# Patient Record
Sex: Female | Born: 1945 | Race: White | Hispanic: No | Marital: Married | Smoking: Never smoker
Health system: Southern US, Community
[De-identification: ages and names within clinical notes are randomized; demographics above are authoritative.]

## PROBLEM LIST (undated history)

## (undated) DIAGNOSIS — M1990 Unspecified osteoarthritis, unspecified site: Secondary | ICD-10-CM

## (undated) DIAGNOSIS — A879 Viral meningitis, unspecified: Secondary | ICD-10-CM

## (undated) DIAGNOSIS — Z9889 Other specified postprocedural states: Secondary | ICD-10-CM

## (undated) DIAGNOSIS — R112 Nausea with vomiting, unspecified: Secondary | ICD-10-CM

## (undated) DIAGNOSIS — C50919 Malignant neoplasm of unspecified site of unspecified female breast: Secondary | ICD-10-CM

## (undated) DIAGNOSIS — C50911 Malignant neoplasm of unspecified site of right female breast: Secondary | ICD-10-CM

## (undated) DIAGNOSIS — G039 Meningitis, unspecified: Secondary | ICD-10-CM

## (undated) DIAGNOSIS — I10 Essential (primary) hypertension: Secondary | ICD-10-CM

## (undated) HISTORY — PX: CATARACT EXTRACTION W/ INTRAOCULAR LENS  IMPLANT, BILATERAL: SHX1307

## (undated) HISTORY — PX: VAGINAL HYSTERECTOMY: SUR661

## (undated) HISTORY — PX: TONSILLECTOMY: SUR1361

## (undated) HISTORY — PX: APPENDECTOMY: SHX54

---

## 1984-09-16 DIAGNOSIS — G039 Meningitis, unspecified: Secondary | ICD-10-CM

## 1984-09-16 HISTORY — DX: Meningitis, unspecified: G03.9

## 1991-09-17 DIAGNOSIS — C50911 Malignant neoplasm of unspecified site of right female breast: Secondary | ICD-10-CM

## 1991-09-17 HISTORY — PX: BREAST LUMPECTOMY: SHX2

## 1991-09-17 HISTORY — DX: Malignant neoplasm of unspecified site of right female breast: C50.911

## 1999-01-09 ENCOUNTER — Other Ambulatory Visit: Admission: RE | Admit: 1999-01-09 | Discharge: 1999-01-09 | Payer: Self-pay | Admitting: Obstetrics and Gynecology

## 1999-01-25 ENCOUNTER — Other Ambulatory Visit: Admission: RE | Admit: 1999-01-25 | Discharge: 1999-01-25 | Payer: Self-pay | Admitting: Obstetrics and Gynecology

## 1999-07-10 ENCOUNTER — Encounter: Admission: RE | Admit: 1999-07-10 | Discharge: 1999-07-10 | Payer: Self-pay | Admitting: Radiation Oncology

## 1999-07-10 ENCOUNTER — Encounter: Payer: Self-pay | Admitting: Radiation Oncology

## 1999-07-31 ENCOUNTER — Other Ambulatory Visit: Admission: RE | Admit: 1999-07-31 | Discharge: 1999-07-31 | Payer: Self-pay | Admitting: Obstetrics and Gynecology

## 2000-01-15 ENCOUNTER — Other Ambulatory Visit: Admission: RE | Admit: 2000-01-15 | Discharge: 2000-01-15 | Payer: Self-pay | Admitting: Obstetrics and Gynecology

## 2000-07-15 ENCOUNTER — Encounter: Admission: RE | Admit: 2000-07-15 | Discharge: 2000-07-15 | Payer: Self-pay | Admitting: Obstetrics and Gynecology

## 2000-07-15 ENCOUNTER — Encounter: Payer: Self-pay | Admitting: Obstetrics and Gynecology

## 2000-07-21 ENCOUNTER — Other Ambulatory Visit: Admission: RE | Admit: 2000-07-21 | Discharge: 2000-07-21 | Payer: Self-pay | Admitting: Obstetrics and Gynecology

## 2001-07-16 ENCOUNTER — Encounter: Payer: Self-pay | Admitting: Obstetrics and Gynecology

## 2001-07-16 ENCOUNTER — Encounter: Admission: RE | Admit: 2001-07-16 | Discharge: 2001-07-16 | Payer: Self-pay | Admitting: Obstetrics and Gynecology

## 2001-07-29 ENCOUNTER — Other Ambulatory Visit: Admission: RE | Admit: 2001-07-29 | Discharge: 2001-07-29 | Payer: Self-pay | Admitting: Obstetrics and Gynecology

## 2002-07-22 ENCOUNTER — Encounter: Payer: Self-pay | Admitting: Obstetrics and Gynecology

## 2002-07-22 ENCOUNTER — Encounter: Admission: RE | Admit: 2002-07-22 | Discharge: 2002-07-22 | Payer: Self-pay | Admitting: Obstetrics and Gynecology

## 2002-08-04 ENCOUNTER — Other Ambulatory Visit: Admission: RE | Admit: 2002-08-04 | Discharge: 2002-08-04 | Payer: Self-pay | Admitting: Obstetrics and Gynecology

## 2003-07-26 ENCOUNTER — Encounter: Admission: RE | Admit: 2003-07-26 | Discharge: 2003-07-26 | Payer: Self-pay | Admitting: Obstetrics and Gynecology

## 2003-08-18 ENCOUNTER — Other Ambulatory Visit: Admission: RE | Admit: 2003-08-18 | Discharge: 2003-08-18 | Payer: Self-pay | Admitting: Obstetrics and Gynecology

## 2004-10-03 ENCOUNTER — Other Ambulatory Visit: Admission: RE | Admit: 2004-10-03 | Discharge: 2004-10-03 | Payer: Self-pay | Admitting: Obstetrics and Gynecology

## 2005-04-02 ENCOUNTER — Other Ambulatory Visit: Admission: RE | Admit: 2005-04-02 | Discharge: 2005-04-02 | Payer: Self-pay | Admitting: Obstetrics and Gynecology

## 2005-11-05 ENCOUNTER — Other Ambulatory Visit: Admission: RE | Admit: 2005-11-05 | Discharge: 2005-11-05 | Payer: Self-pay | Admitting: Obstetrics and Gynecology

## 2007-07-22 ENCOUNTER — Observation Stay (HOSPITAL_COMMUNITY): Admission: EM | Admit: 2007-07-22 | Discharge: 2007-07-22 | Payer: Self-pay | Admitting: Emergency Medicine

## 2008-02-09 ENCOUNTER — Encounter: Admission: RE | Admit: 2008-02-09 | Discharge: 2008-02-09 | Payer: Self-pay | Admitting: Family Medicine

## 2008-07-05 ENCOUNTER — Emergency Department (HOSPITAL_COMMUNITY): Admission: EM | Admit: 2008-07-05 | Discharge: 2008-07-05 | Payer: Self-pay | Admitting: Emergency Medicine

## 2008-07-07 ENCOUNTER — Observation Stay (HOSPITAL_COMMUNITY): Admission: EM | Admit: 2008-07-07 | Discharge: 2008-07-08 | Payer: Self-pay | Admitting: Emergency Medicine

## 2008-07-13 ENCOUNTER — Encounter: Admission: RE | Admit: 2008-07-13 | Discharge: 2008-07-13 | Payer: Self-pay | Admitting: Family Medicine

## 2011-01-29 NOTE — H&P (Signed)
Casey Payne              ACCOUNT NO.:  [id number]   MEDICAL RECORD NO.:  [id number]          PATIENT TYPE:  INP   LOCATION:  1823                         FACILITY:  [facility]   PHYSICIAN:  Ramiro Harvest, MD    DATE OF BIRTH:  September 16, 1946   DATE OF ADMISSION:  07/21/2007  DATE OF DISCHARGE:                              HISTORY & PHYSICAL   PRIMARY CARE PHYSICIAN:  Dr. Bryan Lemma. Manus Gunning, M.D.   HISTORY OF PRESENT ILLNESS:  Casey Payne is an 65 year old white  female with a history of right breast cancer, status post lumpectomy and  radiation in 1993, positive family history of coronary artery disease,  who presents to the emergency room with complaints of mid sternal chest  pain x1 day, left-sided numbness x1 day.  The patient states she woke up  yesterday morning, the day prior to admission, with left-sided neck pain  and numbness, and thought that she may have just laid on the wrong side,  and also had some associated left upper extremity and left lower  extremity numbness that has been worsening since the day prior to  admission.  The patient denies any weakness, no slurred speech, no  facial asymmetry, no visual changes, no gait ataxia, no alleviating or  aggravating factors.  The patient states that she has had prior episodes  in the past that she says usually resolve in approximately a day, but  feels like these symptoms are worsening.  The patient is also  complaining of mid sternal chest pain which started 1 day prior to  admission yesterday afternoon.  On exertion it was a 6/10.  The patient  finds it difficult to describe what the chest pain feels like.  Stated  that it lasted about 2 1/2 to 3 hours, and relieved at rest.  No  radiation.  No syncope.  No diaphoresis.  No nausea.  No vomiting.  No  shortness of breath.  No GERD like symptoms.  No aggravating or  alleviating factors.  The patient states that she took some Aleve for  the chest pain, and also for  extremity numbness with minimal relief.  The patient currently in the ED is chest pain free.   ALLERGIES:  NO KNOWN DRUG ALLERGIES.   PAST MEDICAL HISTORY:  1. Right breast cancer, status post lumpectomy and radiation in 1993.  2. Insomnia.  3. History of meningitis in 1986.  4. Status post hysterectomy in 1980.   MEDICATIONS:  1. Ambien 10 mg p.o. q.h.s.  2. Premarin 0.3 mg p.o. every day.   SOCIAL HISTORY:  The patient lives in [place] with her husband.  No  tobacco.  Occasional alcohol use.  No IV drug use.  The patient has 1  daughter who is healthy.   FAMILY HISTORY:  The patient's mother is alive at age 60, with coronary  artery disease, CHF, fibromyalgia, pulmonary disease, hyperlipidemia.  The patient's father is alive at age 2, with coronary artery disease,  status post CABG x10 or 11 years ago.  The patient has 2 sisters, one  with a history of some form of  palpitations.  There is also a family  history of CVAs and diabetes.   REVIEW OF SYSTEMS:  As per HPI.  GENERAL:  No fevers.  No chills.  No  weight loss.  No fatigue.  No diaphoresis.  HEENT:  No sore throat or  mouth pain.  No tooth pain.  CARDIOVASCULAR:  Positive for chest pain.  Positive for palpitations.  No shortness of breath.  No edema.  No  syncope.  RESPIRATORY:  No shortness of breath.  No cough.  No sputum.  No hemoptysis.  GI:  No nausea.  No vomiting.  No diarrhea.  No  constipation.  No hematemesis.  No melena.  GU:  No dysuria.  No urinary  symptoms.  No hematuria.  NEUROLOGICAL:  No weakness.  Positive  numbness.  No headache.  No visual changes.   PHYSICAL EXAMINATION:  VITAL SIGNS:  Temperature 97.8, blood pressure  166/88, pulse of 56, saturating 99% on room air.  GENERAL:  The patient is lying on the gurney without family at bedside.  HEENT:  Normocephalic, atraumatic.  Pupils equally round and reactive to  light.  Extraocular movements are intact.  NECK:  Supple.  No lymphadenopathy.   Oropharynx is clear.  No lesions.  No exudates.  RESPIRATORY:  Lungs are clear to auscultation bilaterally.  No wheezes.  No rhonchi.  CARDIOVASCULAR:  Heart is regular rate and rhythm.  No murmurs, rubs, or  gallops.  ABDOMEN:  Soft, nontender, nondistended.  Positive bowel sounds.  EXTREMITIES:  No clubbing, cyanosis, or edema.  NEUROLOGICAL:  The patient is alert and oriented x3.  Cranial nerves II-  XII are grossly intact.  No focal deficits.  Facial is symmetric.  Sensation is intact.  Cerebellum is intact.  No focal deficits.  She has  2+ reflexes symmetrically and diffusely.  Gait not tested.   LABORATORY DATA:  D-dimer less than 0.22.  CBC:  White count 6.6,  hemoglobin 13.8, platelets 308,000, hematocrit 40.1.  ANC 3.7.  BMET:  Sodium 139, potassium 4.4, chloride 107, bicarbonate 25, BUN 8,  creatinine 0.79, glucose 87, calcium of 9.0.  Point of care:  CK MB  close to 3 troponin I less than 0.05, myoglobin 91.4.  Chest x-ray no  active cardiopulmonary disease.  Hyperexpansion compatible with  emphysema.  CT of the head no acute intracranial findings.  X-ray of the  neck no acute findings.  Advanced degenerative disk disease in C4-5 and  C6-C7.   ASSESSMENT/PLAN:  Casey Payne is a 65 year old female with a history  of breast cancer, family history of coronary artery disease and cerebral  vascular accidents, who presents with mid sternal chest pain and left-  sided numbness.  1. Chest pain:  Atypical in nature.  The patient is currently chest      pain free.  Differential includes acute coronary syndrome versus      pneumonia which is unlikely with a clear chest x-ray, symptomatic      versus pulmonary embolus which is a low probability, versus aortic      dissection which is a low probability, versus pancreatitis, versus      gastroesophageal reflux disease.  Will go ahead and admit the      patient on a telemetry unit.  Will cycle cardiac enzymes every 8      hours x3.   Check a C-Met.  Check a urinalysis.  Check coags.  Check      lipase.  Check EKG now and  in the morning.  Check a 2D echo to      evaluate LV function.  If there is some EKG changes or elevated      cardiac enzymes, the patient will likely need a cardiac consult for      possible stress test.  Will start the patient on oxygen, aspirin,      nitroglycerin 0.4 mg sublingual every 4 minutes x3 for chest pain,      morphine also for chest pain as needed.  Start the patient on an      ACE inhibitor and Lovenox.  Will check a fasting lipid panel as      well.  Will hold her beta blocker for now secondary to slow heart      rate.  2. Left-sided numbness:  Likely secondary to radiculopathy as the      patient has plain films which show advanced degenerative disk      disease of C4-5 and C6-7, versus an acute cerebral vascular      accident.  A head CT was negative for acute cerebral vascular      accident.  Will check a MRI of the head and neck to rule out a      cerebral vascular accident.  Will check a urinalysis.  Check a      comprehensive metabolic profile.  Check coags.  If MRI shows acute      findings, will need to get further full neurologic cerebral      vascular accident workup with carotid Dopplers and 2D echos.  Will      likely need a neurological consult.  Will give the patient aspirin      325 mg p.o. x1, and will get a PT/OT consult as well.  3. Insomnia:  Continue home dose of Ambien.  4. History of right breast cancer, status post radiation and      lumpectomy:  Stable.  5. Prophylaxis:  Protonix for gastrointestinal prophylaxis, and      Lovenox for deep venous thrombosis prophylaxis.   It has been a pleasure taking care of Casey Payne.      Ramiro Harvest, MD  Electronically Signed     DT/MEDQ  D:  07/21/2007  T:  07/21/2007  Job:  [id number]

## 2011-01-29 NOTE — H&P (Signed)
NAMEVALLERI, HENDRICKSEN              ACCOUNT NO.:  [id number]   MEDICAL RECORD NO.:  [id number]          PATIENT TYPE:  INP   LOCATION:  5033                         FACILITY:  [facility]   PHYSICIAN:  Corinna L. Lendell Caprice, MDDATE OF BIRTH:  1946-01-18   DATE OF ADMISSION:  07/07/2008  DATE OF DISCHARGE:                              HISTORY & PHYSICAL   CHIEF COMPLAINT:  Vomiting.   HISTORY OF PRESENT ILLNESS:  Ms. Casey Payne is a pleasant 65 year old white  female who was diagnosed with flu-like illness on July 05, 2008, in  the emergency room and started on Vicodin and Tamiflu.  She has been  unable to eat much and has taken these medications on an empty stomach.  Since then she has returned with intractable vomiting.  Her symptoms  initially were myalgias, sudden onset, fevers, and chills the day before  and holocephalic headache.  She has no rash.  She has no sick contacts  per se, but apparently several coworkers had the flu.  She has no cough,  shortness of breath, or rhinorrhea.  Initially, her myalgias were  throughout, but now her pain is mainly located in her low back.  She has  had no dysuria, hematuria, or urinary frequency.   PAST MEDICAL HISTORY:  1. Right breast cancer, status post lumpectomy and radiation.  2. Degenerative disk disease.   MEDICATIONS:  Ambien as needed, Premarin, Tamiflu, and Vicodin per  triage notes.   ALLERGIES:  No known drug allergies.   SOCIAL HISTORY:  She works part time at an office job.  She is married.  She does not smoke or drink heavily.  She had no drug use.   FAMILY HISTORY:  Noncontributory.   REVIEW OF SYSTEMS:  As above, otherwise negative.   PHYSICAL EXAMINATION:  VITAL SIGNS:  Temperature is 97, blood pressure  124/88, pulse 72, and respiratory rate 20.  Oxygen saturation 100% on  room air.  GENERAL:  The patient is a ill appearing white female.  HEENT:  Normocephalic and atraumatic.  Pupils equal, round, and reactive  to light  and no conjunctivitis.  Moist mucous membranes.  Oropharynx is  without erythema or exudate.  NECK:  Supple.  No lymphadenopathy.  LUNGS:  Clear to auscultation bilaterally without wheezes, rhonchi, or  rales.  CARDIOVASCULAR:  Regular rate and rhythm without murmurs, gallops, or  rubs.  ABDOMEN:  Normal bowel sounds.  Mild suprapubic tenderness.  BACK:  She has bilateral CVA tenderness, which is quite severe.  GU AND RECTAL:  Deferred.  EXTREMITIES:  No clubbing, cyanosis, or edema.  SKIN:  No rash.  PSYCHIATRIC:  The patient is occasionally tearful.   LABORATORIES:  White count was not done.  Hemoglobin is 9.9 and  hematocrit 29.9.  Basic metabolic panel significant for a creatinine of  2.2 and glucose 127.  Sodium 134, otherwise unremarkable.  Urinalysis  shows a specific gravity of 1.027, small bilirubin with 40 ketones.  Negative blood.  Negative protein.  Negative nitrite.  Negative  leukocyte esterase.  Many squamous epithelial cells.  Many bacteria and  3-6 white cells.  ASSESSMENT AND PLAN:  1. Intractable vomiting with acute renal insufficiency:  Her      creatinine last year was normal.  I suspect, this is somewhat      medication related, but may be due to her flu-like illness.  She      will be admitted for IV fluids and supportive care.  I will start      her on clear liquids and advance as tolerated.  She will get      Phenergan as needed.  She has received Zofran without much relief.      She may have small doses of Dilaudid as needed.  2. Acute renal insufficiency.  See above.  3. Flu-like illness.  Tamiflu has already been started will be      continued.  4. Bilateral costovertebral angle tenderness:  She has no nitrites or      leukocyte esterase, but has many bacteria in a contaminated      specimen.  I will repeat a clean-catch UA C&S and start her on      ciprofloxacin for now pending the repeat urinalysis.   In addition, the patient will get CBC, liver  function test, amylase,  lipase, and a total CPK.      Corinna L. Lendell Caprice, MD  Electronically Signed     CLS/MEDQ  D:  07/07/2008  T:  07/07/2008  Job:  [id number]   cc:   Bryan Lemma. Manus Gunning, M.D.

## 2011-01-29 NOTE — Discharge Summary (Signed)
NAMEMERLINDA, WRUBEL              ACCOUNT NO.:  [id number]   MEDICAL RECORD NO.:  [id number]          PATIENT TYPE:  INP   LOCATION:  5033                         FACILITY:  [facility]   PHYSICIAN:  Corinna L. Lendell Caprice, MDDATE OF BIRTH:  [birth year]   DATE OF ADMISSION:  07/07/2008  DATE OF DISCHARGE:  07/08/2008                               DISCHARGE SUMMARY   DISCHARGE DIAGNOSES:  1. Vomiting, resolved.  2. Acute renal insufficiency, resolved.  3. Flu-like illness.  4. Dehydration.   DISCHARGE MEDICATIONS:  1. Continue Tamiflu b.i.d. for 2 more days, then stop.  2. She is to stop Vicodin.  3. She may continue Premarin 0.3 mg a day.  4. Restasis eye drops twice a day.  5. She may take Tylenol as needed for pain.   ACTIVITY:  Increase slowly.   DIET:  Regular with plenty of fluids.   FOLLOWUP:  With Dr. Manus Gunning if no improvement.   CONSULTATIONS:  None.   PROCEDURES:  None.   LABORATORY DATA:  Initial urinalysis was contaminated as evidenced by  many squamous epithelial cells.  Urine ketones were 40.  Repeat clean  catch UA was negative for anything except for 15 ketones.  CPK, amylase,  lipase, calcium, magnesium, phosphorus, and LFTs all normal.  Initial  basic metabolic panel significant for a creatinine of 2.2.  Her BUN was  19.  At discharge, her BUN is 4 and her creatinine is 0.93.  CBC  unremarkable.   HISTORY AND HOSPITAL COURSE:  Please see H&P for admission details.  Ms.  Grulke is a pleasant 65 year old white female who had been diagnosed  with a flu-like illness and prescribed Tamiflu and Vicodin as needed 2  days prior to admission.  Her symptoms initially were myalgias,  headache, sudden onset fevers, and chills.  She subsequently began  having intractable vomiting and presented back to the emergency room.  She was found to have normal blood pressure and was afebrile.  She also  had a normal pulse, but she had evidence of acute renal insufficiency  felt to  be prerenal in nature.  She was still quite nauseated and felt  ill.  Initially, there was concern about pyelonephritis as she had  bilateral CVA tenderness and many bacteria in the urine.  This, however,  was not a clean catch urinalysis.  Upon repeat, her urinalysis was  negative for UTI.  She initially was started on Cipro, which  was subsequently stopped.  She was started on IV fluids, antiemetics,  and supportive care.  At the time of discharge, she is feeling much  better.  Her nausea has resolved.  She is tolerating a regular diet, has  normal vital signs, and her renal function is now normal.      Corinna L. Lendell Caprice, MD  Electronically Signed     CLS/MEDQ  D:  07/08/2008  T:  07/08/2008  Job:  [id number]   cc:   Bryan Lemma. Manus Gunning, M.D.

## 2011-06-17 LAB — CBC
HCT: 40.8
MCV: 91.1
RDW: 13.3
WBC: 9.8

## 2011-06-17 LAB — BASIC METABOLIC PANEL
BUN: 4 — ABNORMAL LOW
Calcium: 8.5
Creatinine, Ser: 0.93
Glucose, Bld: 90
Sodium: 139

## 2011-06-17 LAB — URINE MICROSCOPIC-ADD ON

## 2011-06-17 LAB — URINALYSIS, ROUTINE W REFLEX MICROSCOPIC
Glucose, UA: NEGATIVE
Ketones, ur: 15 — AB
Ketones, ur: 40 — AB
Nitrite: NEGATIVE
Protein, ur: NEGATIVE
Specific Gravity, Urine: 1.004 — ABNORMAL LOW
Urobilinogen, UA: 1
pH: 7

## 2011-06-17 LAB — AMYLASE: Amylase: 131

## 2011-06-17 LAB — POCT I-STAT, CHEM 8
BUN: 19
Calcium, Ion: 1.04 — ABNORMAL LOW
Chloride: 102
Creatinine, Ser: 2.2 — ABNORMAL HIGH
HCT: 29 — ABNORMAL LOW
Sodium: 134 — ABNORMAL LOW
TCO2: 26

## 2011-06-17 LAB — PHOSPHORUS: Phosphorus: 2.6

## 2011-06-17 LAB — URINE CULTURE

## 2011-06-17 LAB — LIPASE, BLOOD: Lipase: 33

## 2011-06-17 LAB — HEPATIC FUNCTION PANEL
ALT: 22
Alkaline Phosphatase: 53
Bilirubin, Direct: 0.3

## 2011-06-17 LAB — MAGNESIUM: Magnesium: 2

## 2011-06-25 LAB — BASIC METABOLIC PANEL
CO2: 28
Calcium: 9
Chloride: 107
Creatinine, Ser: 0.79
Creatinine, Ser: 0.83
GFR calc Af Amer: >60
GFR calc non Af Amer: >60
Glucose, Bld: 88
Sodium: 139

## 2011-06-25 LAB — CBC
Hemoglobin: 12.6
MCHC: 33.3
MCHC: 34.3
MCV: 89.7
MCV: 92.5
Platelets: 308
RBC: 4.09
RDW: 13.4
WBC: 4.8

## 2011-06-25 LAB — LIPID PANEL
Cholesterol: 188
HDL: 71
LDL Cholesterol: 102 — ABNORMAL HIGH
Triglycerides: 76

## 2011-06-25 LAB — D-DIMER, QUANTITATIVE: D-Dimer, Quant: <0.22

## 2011-06-25 LAB — URINALYSIS, ROUTINE W REFLEX MICROSCOPIC
Hgb urine dipstick: NEGATIVE
Nitrite: NEGATIVE
Protein, ur: NEGATIVE
Specific Gravity, Urine: 1.008
Urobilinogen, UA: 0.2

## 2011-06-25 LAB — HEPATIC FUNCTION PANEL
ALT: 19
Bilirubin, Direct: <0.1
Total Bilirubin: 0.7

## 2011-06-25 LAB — CARDIAC PANEL(CRET KIN+CKTOT+MB+TROPI)
CK, MB: 2.7
CK, MB: 3.1
Relative Index: 2.5
Total CK: 107

## 2011-06-25 LAB — MAGNESIUM: Magnesium: 2.1

## 2011-06-25 LAB — URINE CULTURE: Special Requests: NEGATIVE

## 2011-06-25 LAB — TROPONIN I: Troponin I: 0.01

## 2011-06-25 LAB — POCT CARDIAC MARKERS
CKMB, poc: 3
Myoglobin, poc: 91.4

## 2011-06-25 LAB — CK TOTAL AND CKMB (NOT AT ARMC): Relative Index: 2.9 — ABNORMAL HIGH

## 2011-06-25 LAB — ETHANOL: Alcohol, Ethyl (B): <5

## 2011-06-25 LAB — DIFFERENTIAL
Monocytes Absolute: 0.6
Neutro Abs: 3.7

## 2011-06-25 LAB — LIPASE, BLOOD: Lipase: 29

## 2011-06-25 LAB — HOMOCYSTEINE: Homocysteine: 9.9

## 2013-08-20 ENCOUNTER — Emergency Department (HOSPITAL_COMMUNITY)
Admission: EM | Admit: 2013-08-20 | Discharge: 2013-08-20 | Disposition: A | Discharge status: Home / Self Care | Payer: 59 | Attending: Emergency Medicine | Admitting: Emergency Medicine

## 2013-08-20 ENCOUNTER — Encounter (HOSPITAL_COMMUNITY): Payer: Self-pay | Admitting: Emergency Medicine

## 2013-08-20 ENCOUNTER — Emergency Department (HOSPITAL_COMMUNITY): Payer: 59

## 2013-08-20 DIAGNOSIS — R1011 Right upper quadrant pain: Secondary | ICD-10-CM | POA: Insufficient documentation

## 2013-08-20 DIAGNOSIS — M549 Dorsalgia, unspecified: Secondary | ICD-10-CM | POA: Insufficient documentation

## 2013-08-20 DIAGNOSIS — Z885 Allergy status to narcotic agent status: Secondary | ICD-10-CM | POA: Insufficient documentation

## 2013-08-20 DIAGNOSIS — K8020 Calculus of gallbladder without cholecystitis without obstruction: Secondary | ICD-10-CM | POA: Insufficient documentation

## 2013-08-20 DIAGNOSIS — Z853 Personal history of malignant neoplasm of breast: Secondary | ICD-10-CM | POA: Insufficient documentation

## 2013-08-20 DIAGNOSIS — Z7982 Long term (current) use of aspirin: Secondary | ICD-10-CM | POA: Insufficient documentation

## 2013-08-20 DIAGNOSIS — I10 Essential (primary) hypertension: Secondary | ICD-10-CM | POA: Insufficient documentation

## 2013-08-20 DIAGNOSIS — E871 Hypo-osmolality and hyponatremia: Secondary | ICD-10-CM | POA: Insufficient documentation

## 2013-08-20 DIAGNOSIS — K7689 Other specified diseases of liver: Secondary | ICD-10-CM | POA: Diagnosis not present

## 2013-08-20 DIAGNOSIS — K828 Other specified diseases of gallbladder: Secondary | ICD-10-CM | POA: Diagnosis not present

## 2013-08-20 DIAGNOSIS — Z79899 Other long term (current) drug therapy: Secondary | ICD-10-CM | POA: Insufficient documentation

## 2013-08-20 HISTORY — DX: Malignant neoplasm of unspecified site of unspecified female breast: C50.919

## 2013-08-20 HISTORY — DX: Essential (primary) hypertension: I10

## 2013-08-20 LAB — CBC WITH DIFFERENTIAL/PLATELET
Eosinophils Relative: 3 % (ref 0–5)
HCT: 34.7 % — ABNORMAL LOW (ref 36.0–46.0)
Hemoglobin: 12.3 g/dL (ref 12.0–15.0)
Lymphocytes Relative: 36 % (ref 12–46)
MCV: 88.5 fL (ref 78.0–100.0)
Monocytes Absolute: 0.7 10*3/uL (ref 0.1–1.0)
Monocytes Relative: 11 % (ref 3–12)
Neutro Abs: 3.3 10*3/uL (ref 1.7–7.7)
RDW: 12.4 % (ref 11.5–15.5)
WBC: 6.6 10*3/uL (ref 4.0–10.5)

## 2013-08-20 LAB — URINALYSIS, ROUTINE W REFLEX MICROSCOPIC
Hgb urine dipstick: NEGATIVE
Leukocytes, UA: NEGATIVE
Nitrite: NEGATIVE
Protein, ur: NEGATIVE mg/dL
Specific Gravity, Urine: 1.015 (ref 1.005–1.030)
Urobilinogen, UA: 0.2 mg/dL (ref 0.0–1.0)

## 2013-08-20 LAB — COMPREHENSIVE METABOLIC PANEL
BUN: 10 mg/dL (ref 6–23)
CO2: 25 mEq/L (ref 19–32)
Calcium: 8.8 mg/dL (ref 8.4–10.5)
Chloride: 89 mEq/L — ABNORMAL LOW (ref 96–112)
Creatinine, Ser: 0.82 mg/dL (ref 0.50–1.10)
GFR calc Af Amer: 85 mL/min — ABNORMAL LOW (ref >90)
GFR calc non Af Amer: 73 mL/min — ABNORMAL LOW (ref >90)
Glucose, Bld: 98 mg/dL (ref 70–99)
Total Bilirubin: 0.5 mg/dL (ref 0.3–1.2)

## 2013-08-20 MED ORDER — MORPHINE SULFATE 4 MG/ML IJ SOLN
4.0000 mg | Freq: Once | INTRAMUSCULAR | Status: AC
  Administered 2013-08-20: 4 mg via INTRAVENOUS

## 2013-08-20 MED ORDER — SODIUM CHLORIDE 0.9 % IV SOLN
1000.0000 mL | Freq: Once | INTRAVENOUS | Status: AC
  Administered 2013-08-20: 1000 mL via INTRAVENOUS

## 2013-08-20 MED ORDER — SODIUM CHLORIDE 0.9 % IV SOLN: 1000.0000 mL | INTRAVENOUS | Status: DC

## 2013-08-20 MED ORDER — ONDANSETRON HCL 4 MG/2ML IJ SOLN
4.0000 mg | Freq: Once | INTRAMUSCULAR | Status: AC
  Administered 2013-08-20: 4 mg via INTRAVENOUS

## 2013-08-20 MED ORDER — MORPHINE SULFATE 4 MG/ML IJ SOLN
INTRAMUSCULAR | Status: AC
  Filled 2013-08-20: qty 1

## 2013-08-20 MED ORDER — IOHEXOL 300 MG/ML  SOLN
25.0000 mL | INTRAMUSCULAR | Status: AC
  Administered 2013-08-20: 25 mL via ORAL

## 2013-08-20 MED ORDER — IOHEXOL 300 MG/ML  SOLN
100.0000 mL | Freq: Once | INTRAMUSCULAR | Status: AC | PRN
  Administered 2013-08-20: 100 mL via INTRAVENOUS

## 2013-08-20 MED ORDER — OXYCODONE-ACETAMINOPHEN 7.5-325 MG PO TABS: 1.0000 | ORAL_TABLET | ORAL | Status: DC | PRN

## 2013-08-20 MED ORDER — ONDANSETRON HCL 4 MG/2ML IJ SOLN
INTRAMUSCULAR | Status: AC
  Filled 2013-08-20: qty 2

## 2013-08-20 NOTE — ED Provider Notes (Signed)
CSN: [id number]     Arrival date & time 08/20/13  0024 History   First MD Initiated Contact with Patient 08/20/13 0202     Chief Complaint  Patient presents with  . Abdominal Pain  . Back Pain   (Consider location/radiation/quality/duration/timing/severity/associated sxs/prior Treatment) Patient is a 67 y.o. female presenting with abdominal pain and back pain. The history is provided by the patient.  Abdominal Pain Back Pain Associated symptoms: abdominal pain   She has been having constant pain in her right upper abdomen which is now radiating around to the right flank. Pain is severe and she rates it 10/10. Nothing makes it better nothing makes it worse. It is not affected by eating, body position, the urination, bowel movements. She denies nausea, vomiting, diarrhea. She denies dysuria. She denies fever or chills. She saw a physician he thought might of been a kidney stone but urinalysis was unremarkable. She was given a prescription for oxycodone-acetaminophen which did not give her adequate pain relief.  Past Medical History  Diagnosis Date  . Cancer   . Breast cancer   . Hypertension    Past Surgical History  Procedure Laterality Date  . Breast lumpectomy    . Abdominal hysterectomy    . Tonsillectomy     No family history on file. History  Substance Use Topics  . Smoking status: Never Smoker   . Smokeless tobacco: Not on file  . Alcohol Use: Yes   OB History   Grav Para Term Preterm Abortions TAB SAB Ect Mult Living                 Review of Systems  Gastrointestinal: Positive for abdominal pain.  Musculoskeletal: Positive for back pain.  All other systems reviewed and are negative.    Allergies  Vicodin  Home Medications   Current Outpatient Rx  Name  Route  Sig  Dispense  Refill  . aspirin EC 81 MG tablet   Oral   Take 81 mg by mouth daily.         . calcium carbonate (OS-CAL) 600 MG TABS tablet   Oral   Take 600 mg by mouth daily with  breakfast.         . ENJUVIA 0.3 MG tablet   Oral   Take 1 tablet by mouth daily.         Marland Kitchen losartan (COZAAR) 100 MG tablet   Oral   Take 1 tablet by mouth daily.         . Omega-3 Fatty Acids (FISH OIL PO)   Oral   Take 1 tablet by mouth daily.         Marland Kitchen oxyCODONE-acetaminophen (PERCOCET/ROXICET) 5-325 MG per tablet   Oral   Take 1 tablet by mouth every 4 (four) hours as needed for moderate pain.          . promethazine (PHENERGAN) 25 MG tablet   Oral   Take 1 tablet by mouth every 6 (six) hours as needed for nausea.           BP 154/82  Pulse 62  Temp(Src) 97.7 F (36.5 C) (Oral)  Resp 16  SpO2 99% Physical Exam  Nursing note and vitals reviewed.  67 year old female, resting comfortably and in no acute distress. Vital signs are significant for hypertension with blood pressure 154/82. Oxygen saturation is 99%, which is normal. Head is normocephalic and atraumatic. PERRLA, EOMI. Oropharynx is clear. Neck is nontender and supple without adenopathy or  JVD. Back is nontender and there is no CVA tenderness. Lungs are clear without rales, wheezes, or rhonchi. Chest is nontender. Heart has regular rate and rhythm without murmur. Abdomen is soft, flat, with mild tenderness in the right subcostal area. There are no masses or hepatosplenomegaly and peristalsis is normoactive. Extremities have no cyanosis or edema, full range of motion is present. Skin is warm and dry without rash. Neurologic: Mental status is normal, cranial nerves are intact, there are no motor or sensory deficits.  ED Course  Procedures (including critical care time) Labs Review Results for orders placed during the hospital encounter of 08/20/13  CBC WITH DIFFERENTIAL      Result Value Range   WBC 6.6  4.0 - 10.5 K/uL   RBC 3.92  3.87 - 5.11 MIL/uL   Hemoglobin 12.3  12.0 - 15.0 g/dL   HCT 29.5 (*) 62.1 - 30.8 %   MCV 88.5  78.0 - 100.0 fL   MCH 31.4  26.0 - 34.0 pg   MCHC 35.4  30.0 -  36.0 g/dL   RDW 65.7  84.6 - 96.2 %   Platelets 259  150 - 400 K/uL   Neutrophils Relative % 50  43 - 77 %   Neutro Abs 3.3  1.7 - 7.7 K/uL   Lymphocytes Relative 36  12 - 46 %   Lymphs Abs 2.4  0.7 - 4.0 K/uL   Monocytes Relative 11  3 - 12 %   Monocytes Absolute 0.7  0.1 - 1.0 K/uL   Eosinophils Relative 3  0 - 5 %   Eosinophils Absolute 0.2  0.0 - 0.7 K/uL   Basophils Relative 0  0 - 1 %   Basophils Absolute 0.0  0.0 - 0.1 K/uL  COMPREHENSIVE METABOLIC PANEL      Result Value Range   Sodium 123 (*) 135 - 145 mEq/L   Potassium 4.2  3.5 - 5.1 mEq/L   Chloride 89 (*) 96 - 112 mEq/L   CO2 25  19 - 32 mEq/L   Glucose, Bld 98  70 - 99 mg/dL   BUN 10  6 - 23 mg/dL   Creatinine, Ser 9.52  0.50 - 1.10 mg/dL   Calcium 8.8  8.4 - 84.1 mg/dL   Total Protein 6.5  6.0 - 8.3 g/dL   Albumin 3.4 (*) 3.5 - 5.2 g/dL   AST 20  0 - 37 U/L   ALT 12  0 - 35 U/L   Alkaline Phosphatase 51  39 - 117 U/L   Total Bilirubin 0.5  0.3 - 1.2 mg/dL   GFR calc non Af Amer 73 (*) >90 mL/min   GFR calc Af Amer 85 (*) >90 mL/min  URINALYSIS, ROUTINE W REFLEX MICROSCOPIC      Result Value Range   Color, Urine YELLOW  YELLOW   APPearance CLEAR  CLEAR   Specific Gravity, Urine 1.015  1.005 - 1.030   pH 7.0  5.0 - 8.0   Glucose, UA NEGATIVE  NEGATIVE mg/dL   Hgb urine dipstick NEGATIVE  NEGATIVE   Bilirubin Urine NEGATIVE  NEGATIVE   Ketones, ur 40 (*) NEGATIVE mg/dL   Protein, ur NEGATIVE  NEGATIVE mg/dL   Urobilinogen, UA 0.2  0.0 - 1.0 mg/dL   Nitrite NEGATIVE  NEGATIVE   Leukocytes, UA NEGATIVE  NEGATIVE   Imaging Review US Abdomen Complete  08/20/2013   CLINICAL DATA:  Abdominal pain  EXAM: ULTRASOUND ABDOMEN COMPLETE  COMPARISON:  None.  FINDINGS:  Gallbladder:  Small amount of sludge. No shadowing stones. Negative sonographic Murphy's sign. No wall thickening.  Common bile duct:  Diameter: 3 mm, within normal limits.  Liver:  Upper normal to mildly increased in echogenicity. No focal lesion  visualized.  IVC:  No abnormality visualized.  Pancreas:  Obscured by overlying bowel gas artifact. No appreciable abnormality on today's CT scan.  Spleen:  Size and appearance within normal limits.  Right Kidney:  Length: 10.3 cm. Echogenicity within normal limits. No mass or hydronephrosis visualized.  Left Kidney:  Length: 10.7 cm. Echogenicity within normal limits. No mass or hydronephrosis visualized.  Abdominal aorta:  No aneurysm visualized.  Other findings:  None.  IMPRESSION: Gallbladder sludge. No gallstones or sonographic evidence for cholecystitis.   Electronically Signed   By: Jearld Lesch M.D.   On: 08/20/2013 05:45   Ct Abdomen Pelvis W Contrast  08/20/2013   CLINICAL DATA:  Right lateral abdominal pain  EXAM: CT ABDOMEN AND PELVIS WITH CONTRAST  TECHNIQUE: Multidetector CT imaging of the abdomen and pelvis was performed using the standard protocol following bolus administration of intravenous contrast.  CONTRAST:  OMNIPAQUE IOHEXOL 300 MG/ML  SOLN  COMPARISON:  02/09/2008 pelvis radiograph  FINDINGS: Normal heart size.  Mild bibasilar atelectasis or scarring.  Low attenuation of the liver is nonspecific post contrast however can be seen in the setting of fatty infiltration. Layering gallbladder sludge and/or stones. No pericholecystic fluid or fat stranding. No biliary ductal dilatation. Unremarkable spleen and pancreas. Unremarkable adrenal glands.  Left renal cyst and a couple too small to further characterize hypodensities within each kidney. No hydroureteronephrosis. Unable to follow the ureteral course in their entirety in the decompressed state.  Decompressed distal colon limits evaluation. No overt colitis. Appendix not identified. No right lower quadrant inflammation. Small bowel loops are of normal course and caliber. No free intraperitoneal air. Small amount of free fluid within the pelvis. No lymphadenopathy.  Thin walled bladder.  Uterus not visualized.  No adnexal mass.  No  acute osseous finding.  IMPRESSION: Hepatic steatosis.  Gallbladder sludge and/or stones without CT evidence cholecystitis.  Small amount of free fluid within the pelvis is nonspecific.   Electronically Signed   By: Jearld Lesch M.D.   On: 08/20/2013 04:07    MDM   1. RUQ abdominal pain   2. Cholelithiasis   3. Hyponatremia    Pain in the right upper abdomen and flank of uncertain cause. Although there is no blood in her urine, nephrolithiasis is still possibility as is cholelithiasis. However, the pattern of her pain is suggestive of a possible herpes zoster. CT scan will be obtained to look for evidence of intra-abdominal pathology as well as kidney stones. Hyponatremia is noted of uncertain cause. She is given a bolus of normal saline as well as morphine for pain.  She had reasonable relief of pain with morphine. Laboratory workup was unremarkable except for sodium of 123. This should improve with IV hydration. Liver enzymes are all within normal limits. CT scan shows evidence of cholelithiasis without evidence of cholecystitis. Ultrasound was obtained which confirmed cholelithiasis without cholelithiasis. At this point, I still think the most likely diagnosis is herpes zoster and this has been explained to patient and her son. She is given a prescription for oxycodone-acetaminophen 7.5-325 and she is advised to look for any sign of a rash. If one develops, she is to see a physician to get started on antiviral treatment. Followup with PCP in the next  3-4 days.  Dione Booze, MD 08/20/13 [phone]

## 2013-08-20 NOTE — ED Notes (Signed)
Pt. reports right lateral abdominal / right back pain onset Tuesday this week , denies nausea , vomitting or diarrhea . No hematuria or dysuria . No fever or chills.

## 2013-08-20 NOTE — ED Notes (Signed)
MD at bedside. 

## 2013-08-20 NOTE — ED Notes (Signed)
ED MD in room with pt.

## 2014-05-02 DIAGNOSIS — Z01419 Encounter for gynecological examination (general) (routine) without abnormal findings: Secondary | ICD-10-CM | POA: Diagnosis not present

## 2014-06-22 DIAGNOSIS — Z23 Encounter for immunization: Secondary | ICD-10-CM | POA: Diagnosis not present

## 2014-07-07 DIAGNOSIS — H04123 Dry eye syndrome of bilateral lacrimal glands: Secondary | ICD-10-CM | POA: Diagnosis not present

## 2014-07-07 DIAGNOSIS — Z961 Presence of intraocular lens: Secondary | ICD-10-CM | POA: Diagnosis not present

## 2014-07-11 DIAGNOSIS — Z1389 Encounter for screening for other disorder: Secondary | ICD-10-CM | POA: Diagnosis not present

## 2014-07-11 DIAGNOSIS — D485 Neoplasm of uncertain behavior of skin: Secondary | ICD-10-CM | POA: Diagnosis not present

## 2014-07-11 DIAGNOSIS — Z1211 Encounter for screening for malignant neoplasm of colon: Secondary | ICD-10-CM | POA: Diagnosis not present

## 2014-07-11 DIAGNOSIS — Z23 Encounter for immunization: Secondary | ICD-10-CM | POA: Diagnosis not present

## 2014-07-11 DIAGNOSIS — I10 Essential (primary) hypertension: Secondary | ICD-10-CM | POA: Diagnosis not present

## 2014-07-26 DIAGNOSIS — Z1211 Encounter for screening for malignant neoplasm of colon: Secondary | ICD-10-CM | POA: Diagnosis not present

## 2014-08-08 DIAGNOSIS — I10 Essential (primary) hypertension: Secondary | ICD-10-CM | POA: Diagnosis not present

## 2015-05-15 DIAGNOSIS — Z853 Personal history of malignant neoplasm of breast: Secondary | ICD-10-CM | POA: Diagnosis not present

## 2015-05-15 DIAGNOSIS — E780 Pure hypercholesterolemia: Secondary | ICD-10-CM | POA: Diagnosis not present

## 2015-05-15 DIAGNOSIS — Z0000 Encounter for general adult medical examination without abnormal findings: Secondary | ICD-10-CM | POA: Diagnosis not present

## 2015-05-15 DIAGNOSIS — I10 Essential (primary) hypertension: Secondary | ICD-10-CM | POA: Diagnosis not present

## 2015-05-15 DIAGNOSIS — Z23 Encounter for immunization: Secondary | ICD-10-CM | POA: Diagnosis not present

## 2015-05-15 DIAGNOSIS — Z1211 Encounter for screening for malignant neoplasm of colon: Secondary | ICD-10-CM | POA: Diagnosis not present

## 2015-06-22 DIAGNOSIS — N951 Menopausal and female climacteric states: Secondary | ICD-10-CM | POA: Diagnosis not present

## 2015-06-26 DIAGNOSIS — Z23 Encounter for immunization: Secondary | ICD-10-CM | POA: Diagnosis not present

## 2015-08-31 DIAGNOSIS — Z6822 Body mass index (BMI) 22.0-22.9, adult: Secondary | ICD-10-CM | POA: Diagnosis not present

## 2015-08-31 DIAGNOSIS — Z779 Other contact with and (suspected) exposures hazardous to health: Secondary | ICD-10-CM | POA: Diagnosis not present

## 2015-08-31 DIAGNOSIS — Z1231 Encounter for screening mammogram for malignant neoplasm of breast: Secondary | ICD-10-CM | POA: Diagnosis not present

## 2016-04-25 DIAGNOSIS — M659 Synovitis and tenosynovitis, unspecified: Secondary | ICD-10-CM | POA: Diagnosis not present

## 2016-05-09 DIAGNOSIS — M79644 Pain in right finger(s): Secondary | ICD-10-CM | POA: Diagnosis not present

## 2016-05-16 DIAGNOSIS — Z1389 Encounter for screening for other disorder: Secondary | ICD-10-CM | POA: Diagnosis not present

## 2016-05-16 DIAGNOSIS — E7800 Pure hypercholesterolemia, unspecified: Secondary | ICD-10-CM | POA: Diagnosis not present

## 2016-05-16 DIAGNOSIS — Z1211 Encounter for screening for malignant neoplasm of colon: Secondary | ICD-10-CM | POA: Diagnosis not present

## 2016-05-16 DIAGNOSIS — Z0000 Encounter for general adult medical examination without abnormal findings: Secondary | ICD-10-CM | POA: Diagnosis not present

## 2016-05-16 DIAGNOSIS — I10 Essential (primary) hypertension: Secondary | ICD-10-CM | POA: Diagnosis not present

## 2016-05-16 DIAGNOSIS — G2581 Restless legs syndrome: Secondary | ICD-10-CM | POA: Diagnosis not present

## 2016-05-16 DIAGNOSIS — Z853 Personal history of malignant neoplasm of breast: Secondary | ICD-10-CM | POA: Diagnosis not present

## 2016-05-28 DIAGNOSIS — Z1211 Encounter for screening for malignant neoplasm of colon: Secondary | ICD-10-CM | POA: Diagnosis not present

## 2016-05-31 DIAGNOSIS — E871 Hypo-osmolality and hyponatremia: Secondary | ICD-10-CM | POA: Diagnosis not present

## 2016-06-26 DIAGNOSIS — Z961 Presence of intraocular lens: Secondary | ICD-10-CM | POA: Diagnosis not present

## 2016-06-26 DIAGNOSIS — H26493 Other secondary cataract, bilateral: Secondary | ICD-10-CM | POA: Diagnosis not present

## 2016-06-26 DIAGNOSIS — H524 Presbyopia: Secondary | ICD-10-CM | POA: Diagnosis not present

## 2016-07-15 DIAGNOSIS — I10 Essential (primary) hypertension: Secondary | ICD-10-CM | POA: Diagnosis not present

## 2016-07-15 DIAGNOSIS — E871 Hypo-osmolality and hyponatremia: Secondary | ICD-10-CM | POA: Diagnosis not present

## 2017-05-21 DIAGNOSIS — D692 Other nonthrombocytopenic purpura: Secondary | ICD-10-CM | POA: Diagnosis not present

## 2017-05-21 DIAGNOSIS — Z1389 Encounter for screening for other disorder: Secondary | ICD-10-CM | POA: Diagnosis not present

## 2017-05-21 DIAGNOSIS — E7800 Pure hypercholesterolemia, unspecified: Secondary | ICD-10-CM | POA: Diagnosis not present

## 2017-05-21 DIAGNOSIS — Z1211 Encounter for screening for malignant neoplasm of colon: Secondary | ICD-10-CM | POA: Diagnosis not present

## 2017-05-21 DIAGNOSIS — I10 Essential (primary) hypertension: Secondary | ICD-10-CM | POA: Diagnosis not present

## 2017-05-21 DIAGNOSIS — Z0000 Encounter for general adult medical examination without abnormal findings: Secondary | ICD-10-CM | POA: Diagnosis not present

## 2017-08-20 DIAGNOSIS — Z6822 Body mass index (BMI) 22.0-22.9, adult: Secondary | ICD-10-CM | POA: Diagnosis not present

## 2017-08-20 DIAGNOSIS — Z1231 Encounter for screening mammogram for malignant neoplasm of breast: Secondary | ICD-10-CM | POA: Diagnosis not present

## 2017-08-20 DIAGNOSIS — Z779 Other contact with and (suspected) exposures hazardous to health: Secondary | ICD-10-CM | POA: Diagnosis not present

## 2017-09-25 DIAGNOSIS — L821 Other seborrheic keratosis: Secondary | ICD-10-CM | POA: Diagnosis not present

## 2017-09-25 DIAGNOSIS — L570 Actinic keratosis: Secondary | ICD-10-CM | POA: Diagnosis not present

## 2017-09-25 DIAGNOSIS — H61003 Unspecified perichondritis of external ear, bilateral: Secondary | ICD-10-CM | POA: Diagnosis not present

## 2018-03-03 ENCOUNTER — Other Ambulatory Visit: Payer: Self-pay

## 2018-03-03 ENCOUNTER — Emergency Department (HOSPITAL_COMMUNITY)
Admission: EM | Admit: 2018-03-03 | Discharge: 2018-03-03 | Disposition: A | Discharge status: Home / Self Care | Payer: PPO | Source: Home / Self Care | Attending: Emergency Medicine | Admitting: Emergency Medicine

## 2018-03-03 DIAGNOSIS — R519 Headache, unspecified: Secondary | ICD-10-CM

## 2018-03-03 DIAGNOSIS — G039 Meningitis, unspecified: Secondary | ICD-10-CM | POA: Diagnosis not present

## 2018-03-03 DIAGNOSIS — R509 Fever, unspecified: Secondary | ICD-10-CM | POA: Diagnosis not present

## 2018-03-03 DIAGNOSIS — Z79899 Other long term (current) drug therapy: Secondary | ICD-10-CM | POA: Insufficient documentation

## 2018-03-03 DIAGNOSIS — B003 Herpesviral meningitis: Secondary | ICD-10-CM | POA: Diagnosis not present

## 2018-03-03 DIAGNOSIS — R51 Headache: Secondary | ICD-10-CM | POA: Insufficient documentation

## 2018-03-03 DIAGNOSIS — I10 Essential (primary) hypertension: Secondary | ICD-10-CM

## 2018-03-03 DIAGNOSIS — Z853 Personal history of malignant neoplasm of breast: Secondary | ICD-10-CM

## 2018-03-03 LAB — URINALYSIS, ROUTINE W REFLEX MICROSCOPIC
Bilirubin Urine: NEGATIVE
Glucose, UA: NEGATIVE mg/dL
Hgb urine dipstick: NEGATIVE
Ketones, ur: 5 mg/dL — AB
Nitrite: NEGATIVE
Protein, ur: NEGATIVE mg/dL
Specific Gravity, Urine: 1.01 (ref 1.005–1.030)
pH: 9 — ABNORMAL HIGH (ref 5.0–8.0)

## 2018-03-03 LAB — CBC WITH DIFFERENTIAL/PLATELET
Abs Immature Granulocytes: 0 10*3/uL (ref 0.0–0.1)
Basophils Absolute: 0.1 10*3/uL (ref 0.0–0.1)
Basophils Relative: 1 %
Eosinophils Absolute: 0 10*3/uL (ref 0.0–0.7)
Eosinophils Relative: 0 %
HCT: 43.8 % (ref 36.0–46.0)
Hemoglobin: 14.6 g/dL (ref 12.0–15.0)
Immature Granulocytes: 0 %
Lymphocytes Relative: 19 %
Lymphs Abs: 1.6 10*3/uL (ref 0.7–4.0)
MCH: 31 pg (ref 26.0–34.0)
MCHC: 33.3 g/dL (ref 30.0–36.0)
MCV: 93 fL (ref 78.0–100.0)
Monocytes Absolute: 0.5 10*3/uL (ref 0.1–1.0)
Monocytes Relative: 6 %
Neutro Abs: 6.1 10*3/uL (ref 1.7–7.7)
Neutrophils Relative %: 74 %
Platelets: 306 10*3/uL (ref 150–400)
RBC: 4.71 MIL/uL (ref 3.87–5.11)
RDW: 13.2 % (ref 11.5–15.5)
WBC: 8.4 10*3/uL (ref 4.0–10.5)

## 2018-03-03 LAB — COMPREHENSIVE METABOLIC PANEL WITH GFR
ALT: 19 U/L (ref 14–54)
AST: 25 U/L (ref 15–41)
Albumin: 3.7 g/dL (ref 3.5–5.0)
Alkaline Phosphatase: 65 U/L (ref 38–126)
Anion gap: 8 (ref 5–15)
BUN: 8 mg/dL (ref 6–20)
CO2: 24 mmol/L (ref 22–32)
Calcium: 9.4 mg/dL (ref 8.9–10.3)
Chloride: 101 mmol/L (ref 101–111)
Creatinine, Ser: 1 mg/dL (ref 0.44–1.00)
GFR calc Af Amer: >60 mL/min
GFR calc non Af Amer: 56 mL/min — ABNORMAL LOW
Glucose, Bld: 104 mg/dL — ABNORMAL HIGH (ref 65–99)
Potassium: 4.4 mmol/L (ref 3.5–5.1)
Sodium: 133 mmol/L — ABNORMAL LOW (ref 135–145)
Total Bilirubin: 1 mg/dL (ref 0.3–1.2)
Total Protein: 6.6 g/dL (ref 6.5–8.1)

## 2018-03-03 MED ORDER — MAGNESIUM SULFATE IN D5W 1-5 GM/100ML-% IV SOLN
1.0000 g | Freq: Once | INTRAVENOUS | Status: DC
  Filled 2018-03-03: qty 100

## 2018-03-03 MED ORDER — ONDANSETRON HCL 4 MG/2ML IJ SOLN
4.0000 mg | Freq: Once | INTRAMUSCULAR | Status: AC
  Administered 2018-03-03: 4 mg via INTRAVENOUS
  Filled 2018-03-03: qty 2

## 2018-03-03 MED ORDER — METOCLOPRAMIDE HCL 5 MG/ML IJ SOLN
10.0000 mg | Freq: Once | INTRAMUSCULAR | Status: AC
  Administered 2018-03-03: 10 mg via INTRAVENOUS
  Filled 2018-03-03: qty 2

## 2018-03-03 MED ORDER — ACETAMINOPHEN 500 MG PO TABS
1000.0000 mg | ORAL_TABLET | Freq: Once | ORAL | Status: AC
  Administered 2018-03-03: 1000 mg via ORAL
  Filled 2018-03-03: qty 2

## 2018-03-03 MED ORDER — DOXYCYCLINE HYCLATE 100 MG PO CAPS: 100.0000 mg | ORAL_CAPSULE | Freq: Two times a day (BID) | ORAL | 0 refills | Status: DC

## 2018-03-03 MED ORDER — ACETAMINOPHEN 500 MG PO TABS: 1000.0000 mg | ORAL_TABLET | Freq: Three times a day (TID) | ORAL | 0 refills | Status: DC | PRN

## 2018-03-03 MED ORDER — MAGNESIUM SULFATE 2 GM/50ML IV SOLN
2.0000 g | Freq: Once | INTRAVENOUS | Status: AC
  Administered 2018-03-03: 2 g via INTRAVENOUS
  Filled 2018-03-03: qty 50

## 2018-03-03 MED ORDER — TRAMADOL HCL 50 MG PO TABS: 50.0000 mg | ORAL_TABLET | Freq: Four times a day (QID) | ORAL | 0 refills | Status: DC | PRN

## 2018-03-03 MED ORDER — TRAMADOL HCL 50 MG PO TABS
50.0000 mg | ORAL_TABLET | Freq: Once | ORAL | Status: AC
  Administered 2018-03-03: 50 mg via ORAL
  Filled 2018-03-03: qty 1

## 2018-03-03 MED ORDER — SODIUM CHLORIDE 0.9 % IV BOLUS
500.0000 mL | Freq: Once | INTRAVENOUS | Status: AC
  Administered 2018-03-03: 500 mL via INTRAVENOUS

## 2018-03-03 MED ORDER — KETOROLAC TROMETHAMINE 30 MG/ML IJ SOLN
15.0000 mg | Freq: Once | INTRAMUSCULAR | Status: AC
  Administered 2018-03-03: 15 mg via INTRAVENOUS
  Filled 2018-03-03: qty 1

## 2018-03-03 NOTE — ED Provider Notes (Signed)
  Face-to-face evaluation   History: She presents for evaluation of headache, neck pain, chills, and myalgia.  No rhinorrhea, sore throat, ear pain, cough.  She has had nausea without vomiting.  She is concerned that she has meningitis because she had similar symptoms 30 years ago when she was diagnosed with viral meningitis.  Physical exam: Alert elderly female.  She is uncomfortable.  Neck is supple but she has pain in the lower neck at rest and this increases with flexion.  Nonfocal neurologic exam.  She is alert and lucid.  Medical screening examination/treatment/procedure(s) were conducted as a shared visit with non-physician practitioner(s) and myself.  I personally evaluated the patient during the encounter   Daleen Bo, MD 03/04/18 1529

## 2018-03-03 NOTE — ED Notes (Signed)
Pt reports a slight decrease in headache at this time ; Cristie Hem, PA aware

## 2018-03-03 NOTE — Discharge Instructions (Signed)
Medications: Doxycycline, tramadol, Tylenol  Treatment: Take doxycycline twice daily for 2 weeks.  Take Tylenol every 6-8 hours as needed for headache.  For headache not resolved by Tylenol, you can try taking tramadol as prescribed.  Do not combine Aleve and Advil, or Aleve and Advil and tramadol.  Make sure to drink plenty of fluids.  Follow-up: Please follow-up with your doctor tomorrow the next day for recheck.  Please return to the emergency department immediately if you develop any new or worsening symptoms.  Please understand that we could not rule out meningitis today without completing the lumbar puncture we recommended, however tickborne illness or other viral syndrome is more likely.

## 2018-03-03 NOTE — ED Notes (Signed)
Pt refusing lumbar puncture ; Cristie Hem, PA aware

## 2018-03-03 NOTE — ED Provider Notes (Signed)
[facility] EMERGENCY DEPARTMENT Provider Note   CSN: [id number] Arrival date & time: 03/03/18  0746     History   Chief Complaint Chief Complaint  Patient presents with  . Generalized Body Aches    HPI Casey Payne is a 72 y.o. female with history of hypertension, breast cancer, distant history of viral meningitis 30 years ago who presents with a 1 day history of headache, neck pain, and generalized body aches.  Patient reports taking Aleve at home with some relief.  Patient has had some nausea.  She denies any other symptoms including upper respiratory symptoms, chest pain, shortness of breath, abdominal pain, vomiting, urinary symptoms.  She denies any visual deficits or photophobia.  She reports feeling chills at home, but no documented fever.  HPI  Past Medical History:  Diagnosis Date  . Breast cancer   . Cancer   . Hypertension     There are no active problems to display for this patient.   Past Surgical History:  Procedure Laterality Date  . ABDOMINAL HYSTERECTOMY    . BREAST LUMPECTOMY    . TONSILLECTOMY       OB History   None      Home Medications    Prior to Admission medications   Medication Sig Start Date End Date Taking? Authorizing Provider  acetaminophen (TYLENOL) 500 MG tablet Take 2 tablets (1,000 mg total) by mouth every 8 (eight) hours as needed for headache. 03/03/18   Richele Strand, Bea Graff, PA-C  aspirin EC 81 MG tablet Take 81 mg by mouth daily.    [provider]  calcium carbonate (OS-CAL) 600 MG TABS tablet Take 600 mg by mouth daily with breakfast.    [provider]  doxycycline (VIBRAMYCIN) 100 MG capsule Take 1 capsule (100 mg total) by mouth 2 (two) times daily. 03/03/18   Jamesina Gaugh, Bea Graff, PA-C  ENJUVIA 0.3 MG tablet Take 1 tablet by mouth daily. 07/29/13   [provider]  losartan (COZAAR) 100 MG tablet Take 1 tablet by mouth daily. 06/15/13   [provider]  Omega-3 Fatty  Acids (FISH OIL PO) Take 1 tablet by mouth daily.    [provider]  oxyCODONE-acetaminophen (PERCOCET) 7.5-325 MG per tablet Take 1 tablet by mouth every 4 (four) hours as needed for pain. 84/6/96   Delora Fuel, MD  oxyCODONE-acetaminophen (PERCOCET/ROXICET) 5-325 MG per tablet Take 1 tablet by mouth every 4 (four) hours as needed for moderate pain.  08/18/13   [provider]  promethazine (PHENERGAN) 25 MG tablet Take 1 tablet by mouth every 6 (six) hours as needed for nausea.  08/18/13   [provider]  traMADol (ULTRAM) 50 MG tablet Take 1 tablet (50 mg total) by mouth every 6 (six) hours as needed. 03/03/18   Frederica Kuster, PA-C    Family History No family history on file.  Social History Social History   Tobacco Use  . Smoking status: Never Smoker  Substance Use Topics  . Alcohol use: Yes  . Drug use: No     Allergies   Vicodin [hydrocodone-acetaminophen]   Review of Systems Review of Systems  Constitutional: Negative for chills and fever.  HENT: Negative for congestion, facial swelling and sore throat.   Eyes: Negative for photophobia and visual disturbance.  Respiratory: Negative for shortness of breath.   Cardiovascular: Negative for chest pain.  Gastrointestinal: Positive for nausea. Negative for abdominal pain and vomiting.  Genitourinary: Negative for dysuria.  Musculoskeletal:  Positive for back pain and neck pain.  Skin: Negative for rash and wound.  Neurological: Positive for headaches.  Psychiatric/Behavioral: The patient is not nervous/anxious.      Physical Exam Updated Vital Signs BP 137/81   Pulse 69   Temp 99.8 F (37.7 C) (Oral)   Resp 18   Ht 5\' 1"  (1.549 m)   Wt 52.2 kg (115 lb)   SpO2 97%   BMI 21.73 kg/m   Physical Exam  Constitutional: She appears well-developed and well-nourished. No distress.  HENT:  Head: Normocephalic and atraumatic.  Mouth/Throat: Oropharynx is clear and moist. No oropharyngeal  exudate.  Eyes: Pupils are equal, round, and reactive to light. Conjunctivae are normal. Right eye exhibits no discharge. Left eye exhibits no discharge. No scleral icterus.  Neck: Normal range of motion. Neck supple. No thyromegaly present.  Cardiovascular: Normal rate, regular rhythm, normal heart sounds and intact distal pulses. Exam reveals no gallop and no friction rub.  No murmur heard. Pulmonary/Chest: Effort normal and breath sounds normal. No stridor. No respiratory distress. She has no wheezes. She has no rales.  Abdominal: Soft. Bowel sounds are normal. She exhibits no distension. There is no tenderness. There is no rebound and no guarding.  Musculoskeletal: She exhibits no edema.       Back:  Lymphadenopathy:    She has no cervical adenopathy.  Neurological: She is alert. Coordination normal.  CN 3-12 intact; normal sensation throughout; 5/5 strength in all 4 extremities; equal bilateral grip strength  Skin: Skin is warm and dry. No rash noted. She is not diaphoretic. No pallor.  Psychiatric: She has a normal mood and affect.  Nursing note and vitals reviewed.    ED Treatments / Results  Labs (all labs ordered are listed, but only abnormal results are displayed) Labs Reviewed  COMPREHENSIVE METABOLIC PANEL - Abnormal; Notable for the following components:      Result Value   Sodium 133 (*)    Glucose, Bld 104 (*)    GFR calc non Af Amer 56 (*)    All other components within normal limits  URINALYSIS, ROUTINE W REFLEX MICROSCOPIC - Abnormal; Notable for the following components:   APPearance CLOUDY (*)    pH 9.0 (*)    Ketones, ur 5 (*)    Leukocytes, UA LARGE (*)    Bacteria, UA FEW (*)    All other components within normal limits  URINE CULTURE  CBC WITH DIFFERENTIAL/PLATELET  ROCKY MTN SPOTTED FVR ABS PNL(IGG+IGM)  B. BURGDORFI ANTIBODIES    EKG None  Radiology No results found.  Procedures Procedures (including critical care time)  Medications  Ordered in ED Medications  sodium chloride 0.9 % bolus 500 mL (0 mLs Intravenous Stopped 03/03/18 1007)  metoCLOPramide (REGLAN) injection 10 mg (10 mg Intravenous Given 03/03/18 0903)  ketorolac (TORADOL) 30 MG/ML injection 15 mg (15 mg Intravenous Given 03/03/18 0907)  magnesium sulfate IVPB 2 g 50 mL (0 g Intravenous Stopped 03/03/18 1213)  ondansetron (ZOFRAN) injection 4 mg (4 mg Intravenous Given 03/03/18 1215)  acetaminophen (TYLENOL) tablet 1,000 mg (1,000 mg Oral Given 03/03/18 1301)  traMADol (ULTRAM) tablet 50 mg (50 mg Oral Given 03/03/18 1452)     Initial Impression / Assessment and Plan / ED Course  I have reviewed the triage vital signs and the nursing notes.  Pertinent labs & imaging results that were available during my care of the patient were reviewed by me and considered in my medical decision making (see chart  for details).     Patient with headache, neck pain, and fever.  She has no meningismus.  LP was recommended, but patient declined several times.  Lyme and RMSF titers pending.  Will treat for tickborne illness with doxycycline as this is much more likely. Other viral syndrome is also more likely than meningitis. CBC, CMP unremarkable.  UA shows large leukocytes, 11-20 WBCs.  Patient is asymptomatic.  Urine culture sent, but will treat if positive.  Patient understands the risks of not completing LP to rule out meningitis patient is feeling much better in the ED after several medications, but most after Tylenol.  Will discharge home with doxycycline, Tylenol, tramadol for breakthrough pain.  Patient advised not to combine tramadol with NSAIDs.  Patient advised to follow-up with PCP in 1 to 2 days.  Strict return precautions given.  Patient and family understand and agree with plan.  Patient also evaluated by Dr. Eulis Foster who guided the patient's management and agrees with plan.  Final Clinical Impressions(s) / ED Diagnoses   Final diagnoses:  Fever, unspecified fever cause    Acute nonintractable headache, unspecified headache type    ED Discharge Orders        Ordered    doxycycline (VIBRAMYCIN) 100 MG capsule  2 times daily     03/03/18 1456    traMADol (ULTRAM) 50 MG tablet  Every 6 hours PRN     03/03/18 1456    acetaminophen (TYLENOL) 500 MG tablet  Every 8 hours PRN     03/03/18 1456       Mashanda Ishibashi, Bea Graff, PA-C 03/03/18 1617    Daleen Bo, MD 03/04/18 1529

## 2018-03-03 NOTE — ED Triage Notes (Signed)
Pt c/o of headache/ neck pain/ and body aches that began yesterday ; pt states that she diagnosed with viral meningitis x 10 years ago and states the symptoms feel similar

## 2018-03-03 NOTE — ED Notes (Signed)
Attempted to get more urine for urine culture; pt unable to urinate at this time

## 2018-03-03 NOTE — ED Notes (Addendum)
Pt is unable to provide urine sample at this time. Pt is aware that we need a urine sample. Will try again later.

## 2018-03-03 NOTE — ED Notes (Addendum)
Alex, PA aware of 100.6 Temp; tylenol orders received ; asked if we needed to get a lactic and blood cultures ; PA states " no I talked to the doctor and he doesn't think we need to get them either "

## 2018-03-03 NOTE — ED Notes (Signed)
Attempted to collect urine sample for urine culture , unable to collect at this time ; pt not able to give urine sample

## 2018-03-04 ENCOUNTER — Emergency Department (HOSPITAL_COMMUNITY): Payer: PPO

## 2018-03-04 ENCOUNTER — Encounter (HOSPITAL_COMMUNITY): Payer: Self-pay | Admitting: *Deleted

## 2018-03-04 ENCOUNTER — Other Ambulatory Visit: Payer: Self-pay

## 2018-03-04 ENCOUNTER — Inpatient Hospital Stay (HOSPITAL_COMMUNITY)
Admission: EM | Admit: 2018-03-04 | Discharge: 2018-03-07 | DRG: 076 | Disposition: A | Discharge status: Home / Self Care | Payer: PPO | Attending: Internal Medicine | Admitting: Internal Medicine

## 2018-03-04 DIAGNOSIS — R839 Unspecified abnormal finding in cerebrospinal fluid: Secondary | ICD-10-CM | POA: Diagnosis not present

## 2018-03-04 DIAGNOSIS — Z885 Allergy status to narcotic agent status: Secondary | ICD-10-CM

## 2018-03-04 DIAGNOSIS — Z66 Do not resuscitate: Secondary | ICD-10-CM | POA: Diagnosis not present

## 2018-03-04 DIAGNOSIS — I10 Essential (primary) hypertension: Secondary | ICD-10-CM | POA: Diagnosis not present

## 2018-03-04 DIAGNOSIS — B003 Herpesviral meningitis: Principal | ICD-10-CM | POA: Diagnosis present

## 2018-03-04 DIAGNOSIS — R509 Fever, unspecified: Secondary | ICD-10-CM | POA: Diagnosis not present

## 2018-03-04 DIAGNOSIS — Z853 Personal history of malignant neoplasm of breast: Secondary | ICD-10-CM | POA: Diagnosis not present

## 2018-03-04 DIAGNOSIS — G039 Meningitis, unspecified: Secondary | ICD-10-CM

## 2018-03-04 DIAGNOSIS — R51 Headache: Secondary | ICD-10-CM | POA: Diagnosis not present

## 2018-03-04 DIAGNOSIS — Z79899 Other long term (current) drug therapy: Secondary | ICD-10-CM | POA: Diagnosis not present

## 2018-03-04 DIAGNOSIS — A879 Viral meningitis, unspecified: Secondary | ICD-10-CM

## 2018-03-04 DIAGNOSIS — R112 Nausea with vomiting, unspecified: Secondary | ICD-10-CM | POA: Diagnosis not present

## 2018-03-04 HISTORY — DX: Malignant neoplasm of unspecified site of right female breast: C50.911

## 2018-03-04 HISTORY — DX: Unspecified osteoarthritis, unspecified site: M19.90

## 2018-03-04 HISTORY — DX: Viral meningitis, unspecified: A87.9

## 2018-03-04 HISTORY — DX: Meningitis, unspecified: G03.9

## 2018-03-04 HISTORY — DX: Other specified postprocedural states: Z98.890

## 2018-03-04 HISTORY — DX: Nausea with vomiting, unspecified: R11.2

## 2018-03-04 LAB — COMPREHENSIVE METABOLIC PANEL
ALT: 17 U/L (ref 14–54)
ANION GAP: 10 (ref 5–15)
AST: 23 U/L (ref 15–41)
Albumin: 3.6 g/dL (ref 3.5–5.0)
Alkaline Phosphatase: 58 U/L (ref 38–126)
BUN: 13 mg/dL (ref 6–20)
CO2: 23 mmol/L (ref 22–32)
Calcium: 8.9 mg/dL (ref 8.9–10.3)
Chloride: 99 mmol/L — ABNORMAL LOW (ref 101–111)
Creatinine, Ser: 1 mg/dL (ref 0.44–1.00)
GFR calc Af Amer: >60 mL/min (ref >60)
GFR calc non Af Amer: 56 mL/min — ABNORMAL LOW (ref >60)
GLUCOSE: 101 mg/dL — AB (ref 65–99)
POTASSIUM: 4.4 mmol/L (ref 3.5–5.1)
SODIUM: 132 mmol/L — AB (ref 135–145)
Total Bilirubin: 1.1 mg/dL (ref 0.3–1.2)
Total Protein: 6.4 g/dL — ABNORMAL LOW (ref 6.5–8.1)

## 2018-03-04 LAB — CBC WITH DIFFERENTIAL/PLATELET
Abs Immature Granulocytes: 0 10*3/uL (ref 0.0–0.1)
BASOS ABS: 0 10*3/uL (ref 0.0–0.1)
Basophils Relative: 0 %
EOS ABS: 0 10*3/uL (ref 0.0–0.7)
EOS PCT: 0 %
HCT: 38.7 % (ref 36.0–46.0)
Hemoglobin: 12.9 g/dL (ref 12.0–15.0)
Immature Granulocytes: 0 %
Lymphocytes Relative: 15 %
Lymphs Abs: 1.4 10*3/uL (ref 0.7–4.0)
MCH: 31 pg (ref 26.0–34.0)
MCHC: 33.3 g/dL (ref 30.0–36.0)
MCV: 93 fL (ref 78.0–100.0)
Monocytes Absolute: 0.5 10*3/uL (ref 0.1–1.0)
Monocytes Relative: 6 %
Neutro Abs: 7.2 10*3/uL (ref 1.7–7.7)
Neutrophils Relative %: 79 %
Platelets: 251 10*3/uL (ref 150–400)
RBC: 4.16 MIL/uL (ref 3.87–5.11)
RDW: 13.2 % (ref 11.5–15.5)
WBC: 9.1 10*3/uL (ref 4.0–10.5)

## 2018-03-04 LAB — URINE CULTURE

## 2018-03-04 LAB — I-STAT CG4 LACTIC ACID, ED
LACTIC ACID, VENOUS: 1.13 mmol/L (ref 0.5–1.9)
Lactic Acid, Venous: 1.76 mmol/L (ref 0.5–1.9)

## 2018-03-04 LAB — CSF CELL COUNT WITH DIFFERENTIAL
Eosinophils, CSF: 0 % (ref 0–1)
Eosinophils, CSF: 0 % (ref 0–1)
LYMPHS CSF: 90 % — AB (ref 40–80)
LYMPHS CSF: 91 % — AB (ref 40–80)
MONOCYTE-MACROPHAGE-SPINAL FLUID: 10 % — AB (ref 15–45)
MONOCYTE-MACROPHAGE-SPINAL FLUID: 6 % — AB (ref 15–45)
RBC COUNT CSF: 3 /mm3 — AB
RBC Count, CSF: 1 /mm3 — ABNORMAL HIGH
SEGMENTED NEUTROPHILS-CSF: 3 % (ref 0–6)
Segmented Neutrophils-CSF: 0 % (ref 0–6)
TUBE #: 4
Tube #: 1
WBC CSF: 350 /mm3 — AB (ref 0–5)
WBC CSF: 400 /mm3 — AB (ref 0–5)

## 2018-03-04 LAB — B. BURGDORFI ANTIBODIES

## 2018-03-04 LAB — PROTIME-INR
INR: 0.98
PROTHROMBIN TIME: 12.9 s (ref 11.4–15.2)

## 2018-03-04 LAB — PROTEIN AND GLUCOSE, CSF
Glucose, CSF: 43 mg/dL (ref 40–70)
Total  Protein, CSF: 130 mg/dL — ABNORMAL HIGH (ref 15–45)

## 2018-03-04 LAB — PATHOLOGIST SMEAR REVIEW

## 2018-03-04 MED ORDER — MORPHINE SULFATE (PF) 4 MG/ML IV SOLN
2.0000 mg | INTRAVENOUS | Status: DC | PRN
  Administered 2018-03-04 – 2018-03-06 (×2): 2 mg via INTRAVENOUS
  Filled 2018-03-04 (×2): qty 1

## 2018-03-04 MED ORDER — SODIUM CHLORIDE 0.9 % IV SOLN
INTRAVENOUS | Status: DC
  Administered 2018-03-04 – 2018-03-06 (×2): via INTRAVENOUS

## 2018-03-04 MED ORDER — ONDANSETRON HCL 4 MG PO TABS: 4.0000 mg | ORAL_TABLET | Freq: Four times a day (QID) | ORAL | Status: DC | PRN

## 2018-03-04 MED ORDER — DEXTROSE 5 % IV SOLN
10.0000 mg/kg | Freq: Two times a day (BID) | INTRAVENOUS | Status: DC
  Administered 2018-03-04 – 2018-03-07 (×7): 520 mg via INTRAVENOUS
  Filled 2018-03-04 (×8): qty 10.4

## 2018-03-04 MED ORDER — ONDANSETRON HCL 4 MG/2ML IJ SOLN
4.0000 mg | Freq: Four times a day (QID) | INTRAMUSCULAR | Status: DC | PRN
  Administered 2018-03-06: 4 mg via INTRAVENOUS
  Filled 2018-03-04: qty 2

## 2018-03-04 MED ORDER — KETOROLAC TROMETHAMINE 15 MG/ML IJ SOLN
15.0000 mg | Freq: Four times a day (QID) | INTRAMUSCULAR | Status: DC | PRN
Start: 2018-03-04 — End: 2018-03-07
  Administered 2018-03-06 – 2018-03-07 (×2): 15 mg via INTRAVENOUS
  Filled 2018-03-04 (×2): qty 1

## 2018-03-04 MED ORDER — DOCUSATE SODIUM 100 MG PO CAPS
100.0000 mg | ORAL_CAPSULE | Freq: Two times a day (BID) | ORAL | Status: DC
  Administered 2018-03-04 – 2018-03-07 (×7): 100 mg via ORAL
  Filled 2018-03-04 (×7): qty 1

## 2018-03-04 MED ORDER — LIDOCAINE HCL 2 % IJ SOLN
10.0000 mL | Freq: Once | INTRAMUSCULAR | Status: AC
  Administered 2018-03-04: 200 mg via INTRADERMAL
  Filled 2018-03-04: qty 20

## 2018-03-04 MED ORDER — PROCHLORPERAZINE EDISYLATE 10 MG/2ML IJ SOLN
10.0000 mg | Freq: Once | INTRAMUSCULAR | Status: AC
Start: 2018-03-04 — End: 2018-03-04
  Administered 2018-03-04: 10 mg via INTRAVENOUS
  Filled 2018-03-04: qty 2

## 2018-03-04 MED ORDER — SODIUM CHLORIDE 0.9 % IV BOLUS
1000.0000 mL | Freq: Once | INTRAVENOUS | Status: AC
  Administered 2018-03-04: 1000 mL via INTRAVENOUS

## 2018-03-04 MED ORDER — LACTATED RINGERS IV SOLN
INTRAVENOUS | Status: DC
  Administered 2018-03-04 – 2018-03-05 (×3): via INTRAVENOUS
  Administered 2018-03-05: 150 mL/h via INTRAVENOUS
  Administered 2018-03-05 – 2018-03-07 (×9): via INTRAVENOUS

## 2018-03-04 MED ORDER — OXYCODONE HCL 5 MG PO TABS
5.0000 mg | ORAL_TABLET | ORAL | Status: DC | PRN
  Administered 2018-03-07: 5 mg via ORAL
  Filled 2018-03-04: qty 1

## 2018-03-04 MED ORDER — FENTANYL CITRATE (PF) 100 MCG/2ML IJ SOLN
50.0000 ug | Freq: Once | INTRAMUSCULAR | Status: AC
  Administered 2018-03-04: 50 ug via INTRAVENOUS
  Filled 2018-03-04: qty 2

## 2018-03-04 MED ORDER — VANCOMYCIN HCL IN DEXTROSE 1-5 GM/200ML-% IV SOLN
1000.0000 mg | Freq: Once | INTRAVENOUS | Status: AC
  Administered 2018-03-04: 1000 mg via INTRAVENOUS
  Filled 2018-03-04: qty 200

## 2018-03-04 MED ORDER — SODIUM CHLORIDE 0.9 % IV SOLN
2.0000 g | Freq: Two times a day (BID) | INTRAVENOUS | Status: DC
  Administered 2018-03-04 – 2018-03-05 (×2): 2 g via INTRAVENOUS
  Filled 2018-03-04 (×3): qty 20

## 2018-03-04 MED ORDER — SODIUM CHLORIDE 0.9 % IV BOLUS: 1000.0000 mL | Freq: Once | INTRAVENOUS | Status: DC

## 2018-03-04 MED ORDER — MORPHINE SULFATE (PF) 4 MG/ML IV SOLN
2.0000 mg | Freq: Once | INTRAVENOUS | Status: AC
  Administered 2018-03-04: 2 mg via INTRAVENOUS
  Filled 2018-03-04: qty 1

## 2018-03-04 MED ORDER — ACETAMINOPHEN 650 MG RE SUPP: 650.0000 mg | Freq: Four times a day (QID) | RECTAL | Status: DC | PRN

## 2018-03-04 MED ORDER — DEXAMETHASONE SODIUM PHOSPHATE 10 MG/ML IJ SOLN
10.0000 mg | Freq: Once | INTRAMUSCULAR | Status: AC
Start: 2018-03-04 — End: 2018-03-04
  Administered 2018-03-04: 10 mg via INTRAVENOUS
  Filled 2018-03-04: qty 1

## 2018-03-04 MED ORDER — ONDANSETRON HCL 4 MG/2ML IJ SOLN
4.0000 mg | Freq: Once | INTRAMUSCULAR | Status: AC
  Administered 2018-03-04: 4 mg via INTRAVENOUS
  Filled 2018-03-04: qty 2

## 2018-03-04 MED ORDER — DIPHENHYDRAMINE HCL 50 MG/ML IJ SOLN
25.0000 mg | Freq: Once | INTRAMUSCULAR | Status: AC
  Administered 2018-03-04: 25 mg via INTRAVENOUS
  Filled 2018-03-04: qty 1

## 2018-03-04 MED ORDER — SODIUM CHLORIDE 0.9 % IV SOLN
2.0000 g | Freq: Once | INTRAVENOUS | Status: AC
  Administered 2018-03-04: 2 g via INTRAVENOUS
  Filled 2018-03-04: qty 20

## 2018-03-04 MED ORDER — ACETAMINOPHEN 325 MG PO TABS
650.0000 mg | ORAL_TABLET | Freq: Four times a day (QID) | ORAL | Status: DC | PRN
  Administered 2018-03-05 – 2018-03-06 (×2): 650 mg via ORAL
  Filled 2018-03-04 (×2): qty 2

## 2018-03-04 MED ORDER — VANCOMYCIN HCL 500 MG IV SOLR
500.0000 mg | Freq: Two times a day (BID) | INTRAVENOUS | Status: DC
  Administered 2018-03-04 – 2018-03-05 (×2): 500 mg via INTRAVENOUS
  Filled 2018-03-04 (×3): qty 500

## 2018-03-04 MED ORDER — METOPROLOL SUCCINATE ER 25 MG PO TB24
25.0000 mg | ORAL_TABLET | Freq: Every evening | ORAL | Status: DC
  Administered 2018-03-04 – 2018-03-06 (×3): 25 mg via ORAL
  Filled 2018-03-04 (×4): qty 1

## 2018-03-04 MED ORDER — SODIUM CHLORIDE 0.9 % IV SOLN
Freq: Once | INTRAVENOUS | Status: AC
  Administered 2018-03-04: 09:00:00 via INTRAVENOUS

## 2018-03-04 MED ORDER — ENOXAPARIN SODIUM 40 MG/0.4ML ~~LOC~~ SOLN
40.0000 mg | Freq: Every day | SUBCUTANEOUS | Status: DC
  Administered 2018-03-04 – 2018-03-06 (×3): 40 mg via SUBCUTANEOUS
  Filled 2018-03-04 (×3): qty 0.4

## 2018-03-04 MED ORDER — SODIUM CHLORIDE 0.9 % IV SOLN
2.0000 g | Freq: Once | INTRAVENOUS | Status: DC
  Filled 2018-03-04: qty 2000

## 2018-03-04 MED ORDER — SODIUM CHLORIDE 0.9 % IV SOLN
2.0000 g | Freq: Four times a day (QID) | INTRAVENOUS | Status: DC
  Administered 2018-03-04 – 2018-03-05 (×5): 2 g via INTRAVENOUS
  Filled 2018-03-04 (×8): qty 2000

## 2018-03-04 NOTE — ED Triage Notes (Signed)
Pt returned to ED for further workup of HA. Pt was in ED yesterday for same and told to return if HA worse - for spinal tap. Pt states she doesn't have HAs normally and the last one was 33 yrs ago with meningitis dx. Nausea and vomiting. No photophobia. Neck is supple. Alert and oriented.

## 2018-03-04 NOTE — ED Notes (Signed)
Attempted report x1. 

## 2018-03-04 NOTE — Progress Notes (Signed)
1410 Lab requested an order to draw from the patient's foot. Paged MD- Patient stated she did not want labs drawn from her foot.

## 2018-03-04 NOTE — Progress Notes (Signed)
Pharmacy Antibiotic Note  Gricelda Foland is a 72 y.o. female admitted on 03/04/2018 with HA and fever. Concern for meningitis. Pharmacy has been consulted for acyclovir and vancomycin dosing. CrCl ~40 ml/min.  Plan: -Ceftriaxone 2g IV q12h per MD -Vancomycin 1000mg  IV x1 per MD -Ampicillin 2g IV x1 per MD -Vancomycin 500mg  IV q12h -Acyclovir 10mg /kg q12h  Height: 5\' 1"  (154.9 cm) Weight: 115 lb (52.2 kg) IBW/kg (Calculated) : 47.8  Temp (24hrs), Avg:99.8 F (37.7 C), Min:99 F (37.2 C), Max:100.6 F (38.1 C)  Recent Labs  Lab 03/03/18 0854 03/04/18 0339 03/04/18 0432 03/04/18 0611  WBC 8.4 9.1  --   --   CREATININE 1.00 1.00  --   --   LATICACIDVEN  --   --  1.76 1.13    Estimated Creatinine Clearance: 39.5 mL/min (by C-G formula based on SCr of 1 mg/dL).    Allergies  Allergen Reactions  . Vicodin [Hydrocodone-Acetaminophen] Nausea Only    Antimicrobials this admission: 6/19 Ceftriaxone >> 6/19 Vancomycin >> 6/19 Acyclovir >> 6/19 Ampicillin x1  Dose adjustments this admission: none  Microbiology results: 6/19 UCx: sent 6/19 BCx: sent 6/19 CSF Cx: no organisms 6/19 HSV: ordered  Thank you for allowing pharmacy to be a part of this patient's care.  Arrie Senate, PharmD, BCPS PGY-2 Cardiology Pharmacy Resident Clinical Phone: [phone] 03/04/2018

## 2018-03-04 NOTE — ED Provider Notes (Signed)
[facility] Provider Note   CSN: [id number] Arrival date & time: 03/04/18  0136     History   Chief Complaint Chief Complaint  Patient presents with  . Headache  . Emesis  . Fever    HPI Casey Payne is a 72 y.o. female.  HPI 72 year old female with past medical history as below here with persistent headache.  The patient has had several days of headache, general fatigue, chills, and diffuse body aches.  She was seen in the ED yesterday for the symptoms and had a largely negative work-up.  She declined lumbar puncture at that time.  Since then, patient has had persistent headache, chills, and general malaise.  She said poor appetite.  Denies any abdominal pain.  She had an occasional dry cough.  She describes her headache is generalized, aching, throbbing, and worse with bright lights.  No overt neck stiffness.  No back pain.  No upper or lower extremity weakness or numbness.  No alleviating factors.  Past Medical History:  Diagnosis Date  . Breast cancer ([place]) 1993  . Hypertension   . Meningitis 1986    Patient Active Problem List   Diagnosis Date Noted  . Meningitis 03/04/2018    Past Surgical History:  Procedure Laterality Date  . ABDOMINAL HYSTERECTOMY    . BREAST LUMPECTOMY    . TONSILLECTOMY       OB History   None      Home Medications    Prior to Admission medications   Medication Sig Start Date End Date Taking? Authorizing Provider  metoprolol succinate (TOPROL-XL) 25 MG 24 hr tablet Take 25 mg by mouth every evening. 01/12/18  Yes [provider]  acetaminophen (TYLENOL) 500 MG tablet Take 2 tablets (1,000 mg total) by mouth every 8 (eight) hours as needed for headache. 03/03/18   Law, Bea Graff, PA-C    Family History History reviewed. No pertinent family history.  Social History Social History   Tobacco Use  . Smoking status: Never Smoker  . Smokeless tobacco: Never Used  Substance Use  Topics  . Alcohol use: Yes    Alcohol/week: 2.4 oz    Types: 4 Glasses of wine per week  . Drug use: No     Allergies   Vicodin [hydrocodone-acetaminophen]   Review of Systems Review of Systems  Constitutional: Positive for fatigue. Negative for chills and fever.  HENT: Negative for congestion, rhinorrhea and sore throat.   Eyes: Negative for visual disturbance.  Respiratory: Negative for cough, shortness of breath and wheezing.   Cardiovascular: Negative for chest pain and leg swelling.  Gastrointestinal: Positive for nausea and vomiting. Negative for abdominal pain and diarrhea.  Genitourinary: Negative for dysuria, flank pain, vaginal bleeding and vaginal discharge.  Musculoskeletal: Positive for neck pain.  Skin: Negative for rash.  Allergic/Immunologic: Negative for immunocompromised state.  Neurological: Positive for headaches. Negative for syncope.  Hematological: Does not bruise/bleed easily.  All other systems reviewed and are negative.    Physical Exam Updated Vital Signs BP (!) 157/77   Pulse 80   Temp (S) 99.9 F (37.7 C) (Oral)   Resp 18   Ht 5\' 1"  (1.549 m)   Wt 52.2 kg (115 lb)   SpO2 96%   BMI 21.73 kg/m   Physical Exam  Constitutional: She is oriented to person, place, and time. She appears well-developed and well-nourished. No distress.  Nontoxic  HENT:  Head: Normocephalic and atraumatic.  Eyes: Conjunctivae are normal.  Neck: Neck supple.  No overt rigidity or meningismus.  Cardiovascular: Normal rate, regular rhythm and normal heart sounds. Exam reveals no friction rub.  No murmur heard. Pulmonary/Chest: Effort normal and breath sounds normal. No respiratory distress. She has no wheezes. She has no rales.  Abdominal: She exhibits no distension.  Musculoskeletal: She exhibits no edema.  Neurological: She is alert and oriented to person, place, and time. She exhibits normal muscle tone.  Skin: Skin is warm. Capillary refill takes less than 2  seconds.  Psychiatric: She has a normal mood and affect.  Nursing note and vitals reviewed.   Neurological Exam:  Mental Status: Alert and oriented to person, place, and time. Attention and concentration normal. Speech clear. Recent memory is intact. Cranial Nerves: Visual fields grossly intact. EOMI and PERRLA. No nystagmus noted. Facial sensation intact at forehead, maxillary cheek, and chin/mandible bilaterally. No facial asymmetry or weakness. Hearing grossly normal. Uvula is midline, and palate elevates symmetrically. Normal SCM and trapezius strength. Tongue midline without fasciculations. Motor: Muscle strength 5/5 in proximal and distal UE and LE bilaterally. No pronator drift. Muscle tone normal. Reflexes: 2+ and symmetrical in all four extremities.  Sensation: Intact to light touch in upper and lower extremities distally bilaterally.  Gait: Normal without ataxia. Coordination: Normal FTN bilaterally.      ED Treatments / Results  Labs (all labs ordered are listed, but only abnormal results are displayed) Labs Reviewed  COMPREHENSIVE METABOLIC PANEL - Abnormal; Notable for the following components:      Result Value   Sodium 132 (*)    Chloride 99 (*)    Glucose, Bld 101 (*)    Total Protein 6.4 (*)    GFR calc non Af Amer 56 (*)    All other components within normal limits  CSF CELL COUNT WITH DIFFERENTIAL - Abnormal; Notable for the following components:   RBC Count, CSF 3 (*)    WBC, CSF 350 (*)    Lymphs, CSF 90 (*)    Monocyte-Macrophage-Spinal Fluid 10 (*)    All other components within normal limits  CSF CELL COUNT WITH DIFFERENTIAL - Abnormal; Notable for the following components:   RBC Count, CSF 1 (*)    WBC, CSF 400 (*)    Lymphs, CSF 91 (*)    Monocyte-Macrophage-Spinal Fluid 6 (*)    All other components within normal limits  PROTEIN AND GLUCOSE, CSF - Abnormal; Notable for the following components:   Total  Protein, CSF 130 (*)    All other  components within normal limits  CSF CULTURE  CULTURE, BLOOD (ROUTINE X 2)  CULTURE, BLOOD (ROUTINE X 2)  CBC WITH DIFFERENTIAL/PLATELET  PROTIME-INR  URINALYSIS, ROUTINE W REFLEX MICROSCOPIC  PATHOLOGIST SMEAR REVIEW  HERPES SIMPLEX VIRUS(HSV) DNA BY PCR  HIV ANTIBODY (ROUTINE TESTING)  I-STAT CG4 LACTIC ACID, ED  I-STAT CG4 LACTIC ACID, ED    EKG None  Radiology Dg Chest 2 View  Result Date: 03/04/2018 CLINICAL DATA:  72 y/o  F; fever tonight. EXAM: CHEST - 2 VIEW COMPARISON:  07/21/2007 chest radiograph. FINDINGS: Normal cardiac silhouette. Right axillary surgical clips. Mild bronchitic changes in the lung bases. No focal consolidation. No pleural effusion or pneumothorax. No acute osseous abnormality is evident. IMPRESSION: Mild bronchitic changes in the lung bases.  No focal consolidation. Electronically Signed   By: Kristine Garbe M.D.   On: 03/04/2018 07:08   Ct Head Wo Contrast  Result Date: 03/04/2018 CLINICAL DATA:  Altered level of consciousness. Headache.  Patient was in ED yesterday for headache and told to return if headache worsens. History of meningitis 33 years ago. Nausea and vomiting. EXAM: CT HEAD WITHOUT CONTRAST TECHNIQUE: Contiguous axial images were obtained from the base of the skull through the vertex without intravenous contrast. COMPARISON:  07/13/2008 FINDINGS: Brain: No evidence of acute infarction, hemorrhage, hydrocephalus, extra-axial collection or mass lesion/mass effect. Vascular: No hyperdense vessel or unexpected calcification. Skull: Normal. Negative for fracture or focal lesion. Sinuses/Orbits: No acute finding. Other: None. IMPRESSION: No acute intracranial abnormalities. Electronically Signed   By: Lucienne Capers M.D.   On: 03/04/2018 05:10    Procedures .Critical Care Performed by: Duffy Bruce, MD Authorized by: Duffy Bruce, MD   Critical care provider statement:    Critical care time (minutes):  45   Critical care time  was exclusive of:  Separately billable procedures and treating other patients and teaching time   Critical care was necessary to treat or prevent imminent or life-threatening deterioration of the following conditions:  CNS failure or compromise and sepsis   Critical care was time spent personally by me on the following activities:  Development of treatment plan with patient or surrogate, discussions with consultants, evaluation of patient's response to treatment, examination of patient, obtaining history from patient or surrogate, ordering and performing treatments and interventions, ordering and review of laboratory studies, ordering and review of radiographic studies, pulse oximetry, re-evaluation of patient's condition and review of old charts   I assumed direction of critical care for this patient from another provider in my specialty: no    .Lumbar Puncture Date/Time: 03/04/2018 10:23 AM Performed by: Duffy Bruce, MD Authorized by: Duffy Bruce, MD   Consent:    Consent obtained:  Written   Consent given by:  Patient   Risks discussed:  Bleeding, headache, nerve damage, infection, pain and repeat procedure   Alternatives discussed:  Alternative treatment Pre-procedure details:    Procedure purpose:  Diagnostic   Preparation: Patient was prepped and draped in usual sterile fashion   Anesthesia (see MAR for exact dosages):    Anesthesia method:  Local infiltration   Local anesthetic:  Lidocaine 2% w/o epi Procedure details:    Lumbar space:  L4-L5 interspace   Patient position:  L lateral decubitus   Needle gauge:  20   Needle type:  Diamond point   Needle length (in):  3.5   Ultrasound guidance: no     Number of attempts:  1   Opening pressure (cm H2O):  14   Fluid appearance:  Clear and xanthochromic   Tubes of fluid:  4   Total volume (ml):  6 Post-procedure:    Puncture site:  Adhesive bandage applied   Patient tolerance of procedure:  Tolerated well, no immediate  complications   (including critical care time)  Medications Ordered in ED Medications  metoprolol succinate (TOPROL-XL) 24 hr tablet 25 mg (has no administration in time range)  morphine 4 MG/ML injection 2 mg (has no administration in time range)  enoxaparin (LOVENOX) injection 40 mg (has no administration in time range)  acetaminophen (TYLENOL) tablet 650 mg (has no administration in time range)    Or  acetaminophen (TYLENOL) suppository 650 mg (has no administration in time range)  docusate sodium (COLACE) capsule 100 mg (has no administration in time range)  ondansetron (ZOFRAN) tablet 4 mg (has no administration in time range)    Or  ondansetron (ZOFRAN) injection 4 mg (has no administration in time range)  cefTRIAXone (ROCEPHIN)  2 g in sodium chloride 0.9 % 100 mL IVPB (has no administration in time range)  lactated ringers infusion (has no administration in time range)  oxyCODONE (Oxy IR/ROXICODONE) immediate release tablet 5 mg (has no administration in time range)  ketorolac (TORADOL) 15 MG/ML injection 15 mg (has no administration in time range)  vancomycin (VANCOCIN) 500 mg in sodium chloride 0.9 % 100 mL IVPB (has no administration in time range)  acyclovir (ZOVIRAX) 520 mg in dextrose 5 % 100 mL IVPB (has no administration in time range)  ampicillin (OMNIPEN) 2 g in sodium chloride 0.9 % 100 mL IVPB (has no administration in time range)  ondansetron (ZOFRAN) injection 4 mg (4 mg Intravenous Given 03/04/18 0401)  fentaNYL (SUBLIMAZE) injection 50 mcg (50 mcg Intravenous Given 03/04/18 0401)  sodium chloride 0.9 % bolus 1,000 mL (0 mLs Intravenous Stopped 03/04/18 0517)  lidocaine (XYLOCAINE) 2 % (with pres) injection 200 mg (200 mg Intradermal Given 03/04/18 0540)  prochlorperazine (COMPAZINE) injection 10 mg (10 mg Intravenous Given 03/04/18 0506)  diphenhydrAMINE (BENADRYL) injection 25 mg (25 mg Intravenous Given 03/04/18 0505)  dexamethasone (DECADRON) injection 10 mg (10 mg  Intravenous Given 03/04/18 0855)  cefTRIAXone (ROCEPHIN) 2 g in sodium chloride 0.9 % 100 mL IVPB (0 g Intravenous Stopped 03/04/18 0933)  vancomycin (VANCOCIN) IVPB 1000 mg/200 mL premix (1,000 mg Intravenous New Bag/Given 03/04/18 0854)  0.9 %  sodium chloride infusion ( Intravenous New Bag/Given 03/04/18 0853)  morphine 4 MG/ML injection 2 mg (2 mg Intravenous Given 03/04/18 0900)     Initial Impression / Assessment and Plan / ED Course  I have reviewed the triage vital signs and the nursing notes.  Pertinent labs & imaging results that were available during my care of the patient were reviewed by me and considered in my medical decision making (see chart for details).    72 yo F with PMHx as above here with ongoing fever, headache, general fatigue. Pt non-toxic clinically. Concern for meningitis. She is AOx3, no focal neuro deficits but given her age, CT scan obtained and is neg. Lab work reassuring with normal WBC ,normal LA but CSF (obtained via LP by myself) with significant leukocytosis, also borderline low glu and elevated protein. Suspect viral meningitis/encephalitis but given age, protein/glucose, will cover initially with decadron, broad ABX for CA-meningitis and admit. Pt updated and in agreement.   Final Clinical Impressions(s) / ED Diagnoses   Final diagnoses:  Meningitis    ED Discharge Orders    None       Duffy Bruce, MD 03/04/18 1026

## 2018-03-04 NOTE — ED Notes (Signed)
Pt alert and with family @ bedside VS documented Call light within reach 

## 2018-03-04 NOTE — H&P (Signed)
History and Physical    Casey Payne VHQ:[id number] DOB: 03-11-1946 DOA: 03/04/2018  PCP: Gaynelle Arabian, MD Consultants:  None Patient coming from:  Home - lives with husband; NOK: Father, [phone]  Chief Complaint: headache  HPI: Casey Payne is a 72 y.o. female with medical history significant of breast CA; HTN; and remote meningitis presenting with headache, N/V.  3rd day of severe headache.  Per family, she came to the ER yesterday, had a test for tickborne disease and they gave her antibiotics and discharged her to home.  She returned this AM with severe headache, spinal tap confirms viral meningitis.  +nausea, vomiting.  +photosensitiivity.  No sick contacts.  Review of the ER note from yesterday shows that the patient declined hte recommended LP several times.  Lyme and RMSF titers pending.  Treated with doxy for tickborne illness and given Tylenol and Tramadol for pain; of note, she did not fill either prescription.   ED Course:  Likely viral meningitis.  Covering for bacterial meningitis but suspected viral.  Has h/o meningitis 33 years ago - ?viral.  On droplet precautions.  Review of Systems: As per HPI; otherwise review of systems reviewed and negative.   Ambulatory Status:  Ambulates without assistance  Past Medical History:  Diagnosis Date  . Breast cancer ([place]) 1993  . Hypertension   . Meningitis 1986    Past Surgical History:  Procedure Laterality Date  . ABDOMINAL HYSTERECTOMY    . BREAST LUMPECTOMY    . TONSILLECTOMY      Social History   Socioeconomic History  . Marital status: Married    Spouse name: Not on file  . Number of children: Not on file  . Years of education: Not on file  . Highest education level: Not on file  Occupational History  . Occupation: retired  Scientific laboratory technician  . Financial resource strain: Not on file  . Food insecurity:    Worry: Not on file    Inability: Not on file  . Transportation needs:    Medical: Not  on file    Non-medical: Not on file  Tobacco Use  . Smoking status: Never Smoker  . Smokeless tobacco: Never Used  Substance and Sexual Activity  . Alcohol use: Yes    Alcohol/week: 2.4 oz    Types: 4 Glasses of wine per week  . Drug use: No  . Sexual activity: Not on file  Lifestyle  . Physical activity:    Days per week: Not on file    Minutes per session: Not on file  . Stress: Not on file  Relationships  . Social connections:    Talks on phone: Not on file    Gets together: Not on file    Attends religious service: Not on file    Active member of club or organization: Not on file    Attends meetings of clubs or organizations: Not on file    Relationship status: Not on file  . Intimate partner violence:    Fear of current or ex partner: Not on file    Emotionally abused: Not on file    Physically abused: Not on file    Forced sexual activity: Not on file  Other Topics Concern  . Not on file  Social History Narrative  . Not on file    Allergies  Allergen Reactions  . Vicodin [Hydrocodone-Acetaminophen] Nausea Only    History reviewed. No pertinent family history.  Prior to Admission medications   Medication Sig  Start Date End Date Taking? Authorizing Provider  metoprolol succinate (TOPROL-XL) 25 MG 24 hr tablet Take 25 mg by mouth every evening. 01/12/18  Yes [provider]  acetaminophen (TYLENOL) 500 MG tablet Take 2 tablets (1,000 mg total) by mouth every 8 (eight) hours as needed for headache. 03/03/18   Law, Bea Graff, PA-C  doxycycline (VIBRAMYCIN) 100 MG capsule Take 1 capsule (100 mg total) by mouth 2 (two) times daily. 03/03/18   Law, Bea Graff, PA-C  oxyCODONE-acetaminophen (PERCOCET) 7.5-325 MG per tablet Take 1 tablet by mouth every 4 (four) hours as needed for pain. Patient not taking: Reported on 8/41/3244 01/0/27   Delora Fuel, MD  traMADol (ULTRAM) 50 MG tablet Take 1 tablet (50 mg total) by mouth every 6 (six) hours as needed. 03/03/18    Frederica Kuster, PA-C    Physical Exam: Vitals:   03/04/18 1154 03/04/18 1200 03/04/18 1215 03/04/18 1247  BP:  (!) 149/82 (!) 141/67 132/65  Pulse:  (!) 105 70 62  Resp:    16  Temp: 98.7 F (37.1 C)   98.4 F (36.9 C)  TempSrc: Oral   Oral  SpO2:  97% 97% 97%  Weight:      Height:         General:  Appears ill - lying flat, obvious photophobia Eyes:  EOMI, normal lids, iris ENT:  grossly normal hearing, lips & tongue, mmm Neck:  no LAD, masses or thyromegaly; no carotid bruits Cardiovascular:  RRR, no m/r/g. No LE edema.  Respiratory:   CTA bilaterally with no wheezes/rales/rhonchi.  Normal respiratory effort. Abdomen:  soft, NT, ND, NABS Skin:  no rash or induration seen on limited exam Musculoskeletal:  grossly normal tone BUE/BLE, good ROM, no bony abnormality Lower extremity:  No LE edema.  Limited foot exam with no ulcerations.  2+ distal pulses. Psychiatric:  flat mood and affect, speech fluent and appropriate but limited by pain, AOx3 Neurologic:  CN 2-12 grossly intact, moves all extremities in coordinated fashion, sensation intact    Radiological Exams on Admission: Dg Chest 2 View  Result Date: 03/04/2018 CLINICAL DATA:  73 y/o  F; fever tonight. EXAM: CHEST - 2 VIEW COMPARISON:  07/21/2007 chest radiograph. FINDINGS: Normal cardiac silhouette. Right axillary surgical clips. Mild bronchitic changes in the lung bases. No focal consolidation. No pleural effusion or pneumothorax. No acute osseous abnormality is evident. IMPRESSION: Mild bronchitic changes in the lung bases.  No focal consolidation. Electronically Signed   By: Kristine Garbe M.D.   On: 03/04/2018 07:08   Ct Head Wo Contrast  Result Date: 03/04/2018 CLINICAL DATA:  Altered level of consciousness. Headache. Patient was in ED yesterday for headache and told to return if headache worsens. History of meningitis 33 years ago. Nausea and vomiting. EXAM: CT HEAD WITHOUT CONTRAST TECHNIQUE:  Contiguous axial images were obtained from the base of the skull through the vertex without intravenous contrast. COMPARISON:  07/13/2008 FINDINGS: Brain: No evidence of acute infarction, hemorrhage, hydrocephalus, extra-axial collection or mass lesion/mass effect. Vascular: No hyperdense vessel or unexpected calcification. Skull: Normal. Negative for fracture or focal lesion. Sinuses/Orbits: No acute finding. Other: None. IMPRESSION: No acute intracranial abnormalities. Electronically Signed   By: Lucienne Capers M.D.   On: 03/04/2018 05:10    EKG: not done   Labs on Admission: I have personally reviewed the available labs and imaging studies at the time of the admission.  Pertinent labs:   Lactate 1.76 Normal CBC Lactate 1.76, 1.13 INR  0.98 CSF: 43 glucose, 3 RBC, 130 protein, 350-400 WBC   Assessment/Plan Principal Problem:   Meningitis Active Problems:   Essential hypertension   Meningitis -Bacterial meningitis on CSF tends to have >45 protein, <35 glucose, and >1000 WBCs. -This is likely viral meningitis based on presentation of headache, fever, nuchal rigidity, but fairly low suspicion for bacterial meningitis. -Will also need to cover empirically for aseptic meningitis with IV acyclovir; will try to maintain at least 3L IVF/day while the patient is taking IV acyclovir. -Since the patient is >50yo, will treat with Ampicillin in addition to Rocephin and Vanc. -Pain control with Toradol, oxy IR and morphine. -CSF gram stain negative for organisms, but will continue bacterial meningitis treatment awaiting CSF cultures for at least 24-48 hours.   -HSV PCR also sent for CSF evaluation.  HTN -Continue Toprol XL.   DVT prophylaxis:  Lovenox  Code Status:  DNR - confirmed with patient/family Family Communication: Daughter present throughout evaluation Disposition Plan:  Home once clinically improved Consults called: None  Admission status: Admit - It is my clinical opinion  that admission to INPATIENT is reasonable and necessary because of the expectation that this patient will require hospital care that crosses at least 2 midnights to treat this condition based on the medical complexity of the problems presented.  Given the aforementioned information, the predictability of an adverse outcome is felt to be significant.    Karmen Bongo MD [facility]  If note is complete, please contact covering daytime or nighttime physician. [email or web address] Password [email or web address]  03/04/2018, 1:26 PM

## 2018-03-04 NOTE — ED Notes (Signed)
Patient transported to X-ray 

## 2018-03-04 NOTE — ED Notes (Signed)
Placed perwick patient is resting with call bell in reach and family at bedside

## 2018-03-05 DIAGNOSIS — I10 Essential (primary) hypertension: Secondary | ICD-10-CM

## 2018-03-05 LAB — CBC
HEMATOCRIT: 36 % (ref 36.0–46.0)
Hemoglobin: 12.1 g/dL (ref 12.0–15.0)
MCH: 30.9 pg (ref 26.0–34.0)
MCHC: 33.6 g/dL (ref 30.0–36.0)
MCV: 92.1 fL (ref 78.0–100.0)
PLATELETS: 219 10*3/uL (ref 150–400)
RBC: 3.91 MIL/uL (ref 3.87–5.11)
RDW: 13.1 % (ref 11.5–15.5)
WBC: 9.1 10*3/uL (ref 4.0–10.5)

## 2018-03-05 LAB — URINALYSIS, ROUTINE W REFLEX MICROSCOPIC
BILIRUBIN URINE: NEGATIVE
GLUCOSE, UA: NEGATIVE mg/dL
HGB URINE DIPSTICK: NEGATIVE
Ketones, ur: 5 mg/dL — AB
Leukocytes, UA: NEGATIVE
Nitrite: NEGATIVE
Protein, ur: NEGATIVE mg/dL
Specific Gravity, Urine: 1.016 (ref 1.005–1.030)
pH: 5 (ref 5.0–8.0)

## 2018-03-05 LAB — BASIC METABOLIC PANEL
Anion gap: 5 (ref 5–15)
BUN: 8 mg/dL (ref 6–20)
CHLORIDE: 103 mmol/L (ref 101–111)
CO2: 29 mmol/L (ref 22–32)
CREATININE: 0.89 mg/dL (ref 0.44–1.00)
Calcium: 8.6 mg/dL — ABNORMAL LOW (ref 8.9–10.3)
GFR calc Af Amer: >60 mL/min (ref >60)
GFR calc non Af Amer: >60 mL/min (ref >60)
GLUCOSE: 94 mg/dL (ref 65–99)
POTASSIUM: 3.8 mmol/L (ref 3.5–5.1)
Sodium: 137 mmol/L (ref 135–145)

## 2018-03-05 LAB — ROCKY MTN SPOTTED FVR ABS PNL(IGG+IGM)
RMSF IgG: NEGATIVE
RMSF IgM: 0.18 index (ref 0.00–0.89)

## 2018-03-05 NOTE — Progress Notes (Signed)
PROGRESS NOTE                                                                                                                                                                                                             Patient Demographics:    Casey Payne, is a 72 y.o. female, DOB - [birth year], FGH:[id number]  Admit date - 03/04/2018   Admitting Physician Karmen Bongo, MD  Outpatient Primary MD for the patient is Gaynelle Arabian, MD  LOS - 1   Chief Complaint  Patient presents with  . Headache  . Emesis  . Fever       Brief Narrative   72 y.o. female with medical history significant of breast CA; HTN; and remote meningitis presenting with headache, N/V.  3rd day of severe headache.  Per family, she came to the ER yesterday, had a test for tickborne disease and they gave her antibiotics and discharged her to home.  She returned this AM with severe headache, spinal tap confirms viral meningitis.  +nausea, vomiting.  +photosensitiivity.  No sick contacts.  He was started empirically on vancomycin and Rocephin and ampicillin, and IV acyclovir as well, CSF stain is negative, cultures with no growth so far     Subjective:    Casey Payne today has, No chest pain, No abdominal pain - No Nausea, reports headache significantly improved, still reports some photophobia, no new weakness tingling or numbness, No Cough - SOB.   Assessment  & Plan :    Principal Problem:   Meningitis Active Problems:   Essential hypertension  Meningitis -This is most likely viral meningitis, given 3-400 white blood cells on CSF, mainly lymphocytes, Gram stain is negative, and so far 1 day with no growth and CSF, I have discussed with ID, I will discontinue antibiotic coverage especially she is nontoxic-appearing with no evidence of bacterial infection, so I will DC vancomycin, Rocephin and ampicillin, I will follow on HSV PCR, meanwhile continue with IV acyclovir,  continue with good hydration.  Hypertension -10 you with home dose beta-blockers    Code Status : Full  Family Communication  : daughter at bedside  Disposition Plan  : home when stable   Consults  :  none  Procedures  : LP in ED  DVT Prophylaxis  :  [place] lovenox  Lab Results  Component  Value Date   PLT 251 03/04/2018    Antibiotics  :    Anti-infectives (From admission, onward)   Start     Dose/Rate Route Frequency Ordered Stop   03/04/18 2100  vancomycin (VANCOCIN) 500 mg in sodium chloride 0.9 % 100 mL IVPB     500 mg 100 mL/hr over 60 Minutes Intravenous Every 12 hours 03/04/18 0941     03/04/18 2000  cefTRIAXone (ROCEPHIN) 2 g in sodium chloride 0.9 % 100 mL IVPB     2 g 200 mL/hr over 30 Minutes Intravenous Every 12 hours 03/04/18 0932     03/04/18 1000  acyclovir (ZOVIRAX) 520 mg in dextrose 5 % 100 mL IVPB     10 mg/kg  52.2 kg 110.4 mL/hr over 60 Minutes Intravenous Every 12 hours 03/04/18 0941     03/04/18 1000  ampicillin (OMNIPEN) 2 g in sodium chloride 0.9 % 100 mL IVPB     2 g 300 mL/hr over 20 Minutes Intravenous Every 6 hours 03/04/18 0950     03/04/18 0800  cefTRIAXone (ROCEPHIN) 2 g in sodium chloride 0.9 % 100 mL IVPB     2 g 200 mL/hr over 30 Minutes Intravenous  Once 03/04/18 0758 03/04/18 0933   03/04/18 0800  vancomycin (VANCOCIN) IVPB 1000 mg/200 mL premix     1,000 mg 200 mL/hr over 60 Minutes Intravenous  Once 03/04/18 0758 03/04/18 1133   03/04/18 0800  ampicillin (OMNIPEN) 2 g in sodium chloride 0.9 % 100 mL IVPB  Status:  Discontinued     2 g 300 mL/hr over 20 Minutes Intravenous  Once 03/04/18 0758 03/04/18 0956        Objective:   Vitals:   03/04/18 1247 03/04/18 2000 03/04/18 2111 03/05/18 0613  BP: 132/65  (!) 149/63 (!) 155/74  Pulse: 62  (!) 55 (!) 52  Resp: 16  19 18   Temp: 98.4 F (36.9 C) 98.6 F (37 C) 98.1 F (36.7 C) 98 F (36.7 C)  TempSrc: Oral Oral Oral Oral  SpO2: 97%  97% 97%  Weight:      Height:         Wt Readings from Last 3 Encounters:  03/04/18 52.2 kg (115 lb)  03/03/18 52.2 kg (115 lb)     Intake/Output Summary (Last 24 hours) at 03/05/2018 1449 Last data filed at 03/05/2018 1111 Gross per 24 hour  Intake 643.26 ml  Output 2100 ml  Net -1456.74 ml     Physical Exam  Awake Alert, Oriented X 3, No new F.N deficits, Normal affect Symmetrical Chest wall movement, Good air movement bilaterally, CTAB RRR,No Gallops,Rubs or new Murmurs, No Parasternal Heave +ve B.Sounds, Abd Soft, No tenderness, No rebound - guarding or rigidity. No Cyanosis, Clubbing or edema, No new Rash or bruise     Data Review:    CBC Recent Labs  Lab 03/03/18 0854 03/04/18 0339  WBC 8.4 9.1  HGB 14.6 12.9  HCT 43.8 38.7  PLT 306 251  MCV 93.0 93.0  MCH 31.0 31.0  MCHC 33.3 33.3  RDW 13.2 13.2  LYMPHSABS 1.6 1.4  MONOABS 0.5 0.5  EOSABS 0.0 0.0  BASOSABS 0.1 0.0    Chemistries  Recent Labs  Lab 03/03/18 0854 03/04/18 0339  NA 133* 132*  K 4.4 4.4  CL 101 99*  CO2 24 23  GLUCOSE 104* 101*  BUN 8 13  CREATININE 1.00 1.00  CALCIUM 9.4 8.9  AST 25 23  ALT 19 17  ALKPHOS 65 58  BILITOT 1.0 1.1   ------------------------------------------------------------------------------------------------------------------ No results for input(s): CHOL, HDL, LDLCALC, TRIG, CHOLHDL, LDLDIRECT in the last 72 hours.  Lab Results  Component Value Date   HGBA1C  07/21/2007    5.3 (NOTE)   The ADA recommends the following therapeutic goals for glycemic   control related to Hgb A1C measurement:   Goal of Therapy:   < 7.0% Hgb A1C   Action Suggested:  > 8.0% Hgb A1C   Ref:  Diabetes Care, 22, Suppl. 1, 1999   ------------------------------------------------------------------------------------------------------------------ No results for input(s): TSH, T4TOTAL, T3FREE, THYROIDAB in the last 72 hours.  Invalid input(s):  FREET3 ------------------------------------------------------------------------------------------------------------------ No results for input(s): VITAMINB12, FOLATE, FERRITIN, TIBC, IRON, RETICCTPCT in the last 72 hours.  Coagulation profile Recent Labs  Lab 03/04/18 0339  INR 0.98    No results for input(s): DDIMER in the last 72 hours.  Cardiac Enzymes No results for input(s): CKMB, TROPONINI, MYOGLOBIN in the last 168 hours.  Invalid input(s): CK ------------------------------------------------------------------------------------------------------------------ No results found for: BNP  Inpatient Medications  Scheduled Meds: . docusate sodium  100 mg Oral BID  . enoxaparin (LOVENOX) injection  40 mg Subcutaneous Daily  . metoprolol succinate  25 mg Oral QPM   Continuous Infusions: . sodium chloride 10 mL/hr at 03/04/18 2216  . acyclovir 520 mg (03/05/18 1011)  . ampicillin (OMNIPEN) IV 2 g (03/05/18 1208)  . cefTRIAXone (ROCEPHIN)  IV 2 g (03/05/18 0816)  . lactated ringers 150 mL/hr at 03/05/18 1009  . vancomycin 500 mg (03/05/18 1003)   PRN Meds:.acetaminophen **OR** acetaminophen, ketorolac, morphine injection, ondansetron **OR** ondansetron (ZOFRAN) IV, oxyCODONE  Micro Results Recent Results (from the past 240 hour(s))  Urine culture     Status: Abnormal   Collection Time: 03/03/18 10:40 AM  Result Value Ref Range Status   Specimen Description URINE, CLEAN CATCH  Final   Special Requests   Final    NONE Performed at [facility], [place] [address], [place], [place] [address]    Culture MULTIPLE SPECIES PRESENT, SUGGEST RECOLLECTION (A)  Final   Report Status 03/04/2018 FINAL  Final  Blood culture (routine x 2)     Status: None (Preliminary result)   Collection Time: 03/04/18  3:39 AM  Result Value Ref Range Status   Specimen Description BLOOD LEFT WRIST  Final   Special Requests   Final    BOTTLES DRAWN AEROBIC AND ANAEROBIC Blood Culture results may  not be optimal due to an inadequate volume of blood received in culture bottles   Culture   Final    NO GROWTH 1 DAY Performed at [facility], [place] [address], [place], [place] [address]    Report Status PENDING  Incomplete  Blood culture (routine x 2)     Status: None (Preliminary result)   Collection Time: 03/04/18  3:54 AM  Result Value Ref Range Status   Specimen Description BLOOD LEFT HAND  Final   Special Requests   Final    BOTTLES DRAWN AEROBIC ONLY Blood Culture adequate volume   Culture   Final    NO GROWTH 1 DAY Performed at [facility], [place] [address], [place], [place] [address]    Report Status PENDING  Incomplete  CSF culture     Status: None (Preliminary result)   Collection Time: 03/04/18  6:04 AM  Result Value Ref Range Status   Specimen Description CSF  Final   Special Requests NONE  Final  Gram Stain   Final    FEW WBC PRESENT, PREDOMINANTLY MONONUCLEAR NO ORGANISMS SEEN    Culture   Final    NO GROWTH 1 DAY Performed at [place] [address] [place], [place] [address]    Report Status PENDING  Incomplete    Radiology Reports Dg Chest 2 View  Result Date: 03/04/2018 CLINICAL DATA:  72 y/o  F; fever tonight. EXAM: CHEST - 2 VIEW COMPARISON:  07/21/2007 chest radiograph. FINDINGS: Normal cardiac silhouette. Right axillary surgical clips. Mild bronchitic changes in the lung bases. No focal consolidation. No pleural effusion or pneumothorax. No acute osseous abnormality is evident. IMPRESSION: Mild bronchitic changes in the lung bases.  No focal consolidation. Electronically Signed   By: Kristine Garbe M.D.   On: 03/04/2018 07:08   Ct Head Wo Contrast  Result Date: 03/04/2018 CLINICAL DATA:  Altered level of consciousness. Headache. Patient was in ED yesterday for headache and told to return if headache worsens. History of meningitis 33 years ago. Nausea and vomiting. EXAM: CT HEAD WITHOUT CONTRAST TECHNIQUE: Contiguous  axial images were obtained from the base of the skull through the vertex without intravenous contrast. COMPARISON:  07/13/2008 FINDINGS: Brain: No evidence of acute infarction, hemorrhage, hydrocephalus, extra-axial collection or mass lesion/mass effect. Vascular: No hyperdense vessel or unexpected calcification. Skull: Normal. Negative for fracture or focal lesion. Sinuses/Orbits: No acute finding. Other: None. IMPRESSION: No acute intracranial abnormalities. Electronically Signed   By: Lucienne Capers M.D.   On: 03/04/2018 05:10      Phillips Climes M.D on 03/05/2018 at 2:49 PM  Between 7am to 7pm - Pager - [phone]  After 7pm go to [email or web address] - password [facility]  [facility] -  Office  [phone]

## 2018-03-05 NOTE — Progress Notes (Signed)
Patient refused lab draw this morning. 

## 2018-03-06 MED ORDER — HYDRALAZINE HCL 20 MG/ML IJ SOLN: 5.0000 mg | Freq: Four times a day (QID) | INTRAMUSCULAR | Status: DC | PRN

## 2018-03-06 MED ORDER — SODIUM CHLORIDE 0.9 % IV SOLN
500.0000 mg | Freq: Once | INTRAVENOUS | Status: AC
  Administered 2018-03-06: 500 mg via INTRAVENOUS
  Filled 2018-03-06: qty 2

## 2018-03-06 MED ORDER — AMLODIPINE BESYLATE 5 MG PO TABS
10.0000 mg | ORAL_TABLET | Freq: Every day | ORAL | Status: DC
  Administered 2018-03-06 – 2018-03-07 (×2): 10 mg via ORAL
  Filled 2018-03-06 (×2): qty 2

## 2018-03-06 MED ORDER — ZOLPIDEM TARTRATE 5 MG PO TABS: 5.0000 mg | ORAL_TABLET | Freq: Every evening | ORAL | Status: DC | PRN

## 2018-03-06 MED ORDER — PROMETHAZINE HCL 25 MG/ML IJ SOLN
12.5000 mg | Freq: Four times a day (QID) | INTRAMUSCULAR | Status: DC | PRN
  Administered 2018-03-06: 12.5 mg via INTRAVENOUS
  Filled 2018-03-06: qty 1

## 2018-03-06 MED ORDER — BUTALBITAL-APAP-CAFFEINE 50-325-40 MG PO TABS
1.0000 | ORAL_TABLET | Freq: Four times a day (QID) | ORAL | Status: AC
  Administered 2018-03-06 (×2): 1 via ORAL
  Filled 2018-03-06 (×2): qty 1

## 2018-03-06 NOTE — Consult Note (Signed)
[facility] Primary Care Navigator  03/06/2018  Casey Payne 12-28-1945 [id number]   Met withpatientat the bedsideto identify possible discharge needs. Patientreportshaving "severe headache, fever, nausea and vomiting that had led to this admission.(viral meningitis, essential HTN)  PatientendorsesDr. Gaynelle Arabian with [facility] as theprimary care provider.   [facility] [facility].   Patientstatesthat she managesher ownmedications at home, straight out of the containers.  Patient reportsthathusband has been driving and providingtransportation toherdoctors'appointments after discharge.  Patientstates thathusband will be her primary caregiver at home.  Anticipated discharge plan ishome when stable.  Patientvoiced understanding to call primary care provider's office whenshereturns home for a post discharge follow-up appointment within1- 2 weeksor sooner if needs arise.Patient letter (with PCP's contact number) was provided asareminder.  Explained topatientregardingTHN CM services available for health management and resourcesat home butshe denies any current concerns orneeds andhaddeclinedservices which include EMMI calls to follow-up with herrecovery. Patient states that she "does not feel it is necessary at this time".  Patient expressed understanding to seekreferral to [facility] care managementfrom primary care providerasdeemed necessary and appropriate for anyservicesin thefuture.   [facility] care management information provided for future needs that may arise.  Primary care provider's office is listed as providing transition of care (TOC) follow-up.   For additional questions please contact:  Edwena Felty A. Lavita Pontius, BSN, RN-BC [facility] PRIMARY CARE Navigator Cell: [phone]

## 2018-03-06 NOTE — Progress Notes (Signed)
PROGRESS NOTE                                                                                                                                                                                                             Patient Demographics:    Casey Payne, is a 72 y.o. female, DOB - [birth year], VXB:[id number]  Admit date - 03/04/2018   Admitting Physician Karmen Bongo, MD  Outpatient Primary MD for the patient is Gaynelle Arabian, MD  LOS - 2   Chief Complaint  Patient presents with  . Headache  . Emesis  . Fever       Brief Narrative   72 y.o. female with medical history significant of breast CA; HTN; and remote meningitis presenting with headache, N/V.  3rd day of severe headache.  Per family, she came to the ER yesterday, had a test for tickborne disease and they gave her antibiotics and discharged her to home.  She returned this AM with severe headache, spinal tap confirms viral meningitis.  +nausea, vomiting.  +photosensitiivity.  No sick contacts.  He was started empirically on vancomycin and Rocephin and ampicillin, and IV acyclovir as well, CSF stain is negative, cultures with no growth so far     Subjective:    Casey Payne today has, No chest pain, No abdominal pain -does report some nausea after receiving pain medication, as well she does report headache , and still some photophobia .   Assessment  & Plan :    Principal Problem:   Meningitis Active Problems:   Essential hypertension  Meningitis -This is most likely viral meningitis, initially on empiric antibiotic coverage including vancomycin, Rocephin and ampicillin, which I have stopped after discussion with ID on 03/05/2018, given 300-400 white blood cells on CSF, mainly lymphocytes, Gram stain is negative, as well so far no growth and CSF at the 2 . -Continue with IV acyclovir coverage for now, pending H SV PCR, I have discussed with [facility], they did receive the sample  today, and they report the turnaround time is 2 to 4 days .  Headache  -Is most likely in the setting of her meningitis, he did have LP done yesterday, so as well possibly LP related headache, but less likely as it is stent and unrelated to ambulation, but give IV caffeine, give couple doses of Fioricet, continue  with IV fluids .  Hypertension - blood pressure remains significantly uncontrolled despite being on home dose beta-blockers, most likely due to discomfort and IV fluids, I will start on amlodipine and add as needed hydralazine    Code Status : Full  Family Communication  : daughter husband at bedside  Disposition Plan  : home when stable   Consults  :  none  Procedures  : LP in ED  DVT Prophylaxis  :  [place] lovenox(today Lovenox, I will hold any further intervention is needed by tomorrow)  Lab Results  Component Value Date   PLT 219 03/05/2018    Antibiotics  :    Anti-infectives (From admission, onward)   Start     Dose/Rate Route Frequency Ordered Stop   03/04/18 2100  vancomycin (VANCOCIN) 500 mg in sodium chloride 0.9 % 100 mL IVPB  Status:  Discontinued     500 mg 100 mL/hr over 60 Minutes Intravenous Every 12 hours 03/04/18 0941 03/05/18 1456   03/04/18 2000  cefTRIAXone (ROCEPHIN) 2 g in sodium chloride 0.9 % 100 mL IVPB  Status:  Discontinued     2 g 200 mL/hr over 30 Minutes Intravenous Every 12 hours 03/04/18 0932 03/05/18 1456   03/04/18 1000  acyclovir (ZOVIRAX) 520 mg in dextrose 5 % 100 mL IVPB     10 mg/kg  52.2 kg 110.4 mL/hr over 60 Minutes Intravenous Every 12 hours 03/04/18 0941     03/04/18 1000  ampicillin (OMNIPEN) 2 g in sodium chloride 0.9 % 100 mL IVPB  Status:  Discontinued     2 g 300 mL/hr over 20 Minutes Intravenous Every 6 hours 03/04/18 0950 03/05/18 1456   03/04/18 0800  cefTRIAXone (ROCEPHIN) 2 g in sodium chloride 0.9 % 100 mL IVPB     2 g 200 mL/hr over 30 Minutes Intravenous  Once 03/04/18 0758 03/04/18 0933   03/04/18 0800   vancomycin (VANCOCIN) IVPB 1000 mg/200 mL premix     1,000 mg 200 mL/hr over 60 Minutes Intravenous  Once 03/04/18 0758 03/04/18 1133   03/04/18 0800  ampicillin (OMNIPEN) 2 g in sodium chloride 0.9 % 100 mL IVPB  Status:  Discontinued     2 g 300 mL/hr over 20 Minutes Intravenous  Once 03/04/18 0758 03/04/18 0956        Objective:   Vitals:   03/04/18 2111 03/05/18 0613 03/05/18 2100 03/06/18 0611  BP: (!) 149/63 (!) 155/74 (!) 165/79 (!) 175/74  Pulse: (!) 55 (!) 52 61 60  Resp: 19 18 18 18   Temp: 98.1 F (36.7 C) 98 F (36.7 C) 99.3 F (37.4 C) 98.1 F (36.7 C)  TempSrc: Oral Oral Oral Oral  SpO2: 97% 97% 97% 96%  Weight:      Height:        Wt Readings from Last 3 Encounters:  03/04/18 52.2 kg (115 lb)  03/03/18 52.2 kg (115 lb)     Intake/Output Summary (Last 24 hours) at 03/06/2018 1130 Last data filed at 03/06/2018 1123 Gross per 24 hour  Intake 5597.3 ml  Output 5750 ml  Net -152.7 ml     Physical Exam  Awake Alert, Oriented X 3, No new F.N deficits, Normal affect Symmetrical Chest wall movement, Good air movement bilaterally, CTAB RRR,No Gallops,Rubs or new Murmurs, No Parasternal Heave +ve B.Sounds, Abd Soft, No tenderness, No rebound - guarding or rigidity. No Cyanosis, Clubbing or edema, No new Rash or bruise       Data Review:  CBC Recent Labs  Lab 03/03/18 0854 03/04/18 0339 03/05/18 1710  WBC 8.4 9.1 9.1  HGB 14.6 12.9 12.1  HCT 43.8 38.7 36.0  PLT 306 251 219  MCV 93.0 93.0 92.1  MCH 31.0 31.0 30.9  MCHC 33.3 33.3 33.6  RDW 13.2 13.2 13.1  LYMPHSABS 1.6 1.4  --   MONOABS 0.5 0.5  --   EOSABS 0.0 0.0  --   BASOSABS 0.1 0.0  --     Chemistries  Recent Labs  Lab 03/03/18 0854 03/04/18 0339 03/05/18 1710  NA 133* 132* 137  K 4.4 4.4 3.8  CL 101 99* 103  CO2 24 23 29   GLUCOSE 104* 101* 94  BUN 8 13 8   CREATININE 1.00 1.00 0.89  CALCIUM 9.4 8.9 8.6*  AST 25 23  --   ALT 19 17  --   ALKPHOS 65 58  --   BILITOT 1.0  1.1  --    ------------------------------------------------------------------------------------------------------------------ No results for input(s): CHOL, HDL, LDLCALC, TRIG, CHOLHDL, LDLDIRECT in the last 72 hours.  Lab Results  Component Value Date   HGBA1C  07/21/2007    5.3 (NOTE)   The ADA recommends the following therapeutic goals for glycemic   control related to Hgb A1C measurement:   Goal of Therapy:   < 7.0% Hgb A1C   Action Suggested:  > 8.0% Hgb A1C   Ref:  Diabetes Care, 22, Suppl. 1, 1999   ------------------------------------------------------------------------------------------------------------------ No results for input(s): TSH, T4TOTAL, T3FREE, THYROIDAB in the last 72 hours.  Invalid input(s): FREET3 ------------------------------------------------------------------------------------------------------------------ No results for input(s): VITAMINB12, FOLATE, FERRITIN, TIBC, IRON, RETICCTPCT in the last 72 hours.  Coagulation profile Recent Labs  Lab 03/04/18 0339  INR 0.98    No results for input(s): DDIMER in the last 72 hours.  Cardiac Enzymes No results for input(s): CKMB, TROPONINI, MYOGLOBIN in the last 168 hours.  Invalid input(s): CK ------------------------------------------------------------------------------------------------------------------ No results found for: BNP  Inpatient Medications  Scheduled Meds: . amLODipine  10 mg Oral Daily  . butalbital-acetaminophen-caffeine  1 tablet Oral Q6H  . docusate sodium  100 mg Oral BID  . enoxaparin (LOVENOX) injection  40 mg Subcutaneous Daily  . metoprolol succinate  25 mg Oral QPM   Continuous Infusions: . sodium chloride 10 mL/hr at 03/05/18 2135  . acyclovir 520 mg (03/06/18 1056)  . caffeine sodium benzoate IVPB    . lactated ringers 150 mL/hr at 03/06/18 1053   PRN Meds:.acetaminophen **OR** acetaminophen, hydrALAZINE, ketorolac, morphine injection, ondansetron **OR** ondansetron  (ZOFRAN) IV, oxyCODONE, promethazine, zolpidem  Micro Results Recent Results (from the past 240 hour(s))  Urine culture     Status: Abnormal   Collection Time: 03/03/18 10:40 AM  Result Value Ref Range Status   Specimen Description URINE, CLEAN CATCH  Final   Special Requests   Final    NONE Performed at [facility], [place] [address] [place], [place] [address]    Culture MULTIPLE SPECIES PRESENT, SUGGEST RECOLLECTION (A)  Final   Report Status 03/04/2018 FINAL  Final  Blood culture (routine x 2)     Status: None (Preliminary result)   Collection Time: 03/04/18  3:39 AM  Result Value Ref Range Status   Specimen Description BLOOD LEFT WRIST  Final   Special Requests   Final    BOTTLES DRAWN AEROBIC AND ANAEROBIC Blood Culture results may not be optimal due to an inadequate volume of blood received in culture bottles   Culture   Final  NO GROWTH 1 DAY Performed at [facility], [place] [address], [place], [place] [address]    Report Status PENDING  Incomplete  Blood culture (routine x 2)     Status: None (Preliminary result)   Collection Time: 03/04/18  3:54 AM  Result Value Ref Range Status   Specimen Description BLOOD LEFT HAND  Final   Special Requests   Final    BOTTLES DRAWN AEROBIC ONLY Blood Culture adequate volume   Culture   Final    NO GROWTH 1 DAY Performed at [facility], [place] [address] [place], [place] [address]    Report Status PENDING  Incomplete  CSF culture     Status: None (Preliminary result)   Collection Time: 03/04/18  6:04 AM  Result Value Ref Range Status   Specimen Description CSF  Final   Special Requests NONE  Final   Gram Stain   Final    FEW WBC PRESENT, PREDOMINANTLY MONONUCLEAR NO ORGANISMS SEEN    Culture   Final    NO GROWTH 2 DAYS Performed at [place] [address], [place], [place] [address]    Report Status PENDING  Incomplete    Radiology Reports Dg Chest 2 View  Result Date: 03/04/2018 CLINICAL  DATA:  72 y/o  F; fever tonight. EXAM: CHEST - 2 VIEW COMPARISON:  07/21/2007 chest radiograph. FINDINGS: Normal cardiac silhouette. Right axillary surgical clips. Mild bronchitic changes in the lung bases. No focal consolidation. No pleural effusion or pneumothorax. No acute osseous abnormality is evident. IMPRESSION: Mild bronchitic changes in the lung bases.  No focal consolidation. Electronically Signed   By: Kristine Garbe M.D.   On: 03/04/2018 07:08   Ct Head Wo Contrast  Result Date: 03/04/2018 CLINICAL DATA:  Altered level of consciousness. Headache. Patient was in ED yesterday for headache and told to return if headache worsens. History of meningitis 33 years ago. Nausea and vomiting. EXAM: CT HEAD WITHOUT CONTRAST TECHNIQUE: Contiguous axial images were obtained from the base of the skull through the vertex without intravenous contrast. COMPARISON:  07/13/2008 FINDINGS: Brain: No evidence of acute infarction, hemorrhage, hydrocephalus, extra-axial collection or mass lesion/mass effect. Vascular: No hyperdense vessel or unexpected calcification. Skull: Normal. Negative for fracture or focal lesion. Sinuses/Orbits: No acute finding. Other: None. IMPRESSION: No acute intracranial abnormalities. Electronically Signed   By: Lucienne Capers M.D.   On: 03/04/2018 05:10      Phillips Climes M.D on 03/06/2018 at 11:30 AM  Between 7am to 7pm - Pager - [phone]  After 7pm go to [email or web address] - password [facility]  [facility] -  Office  [phone]

## 2018-03-07 LAB — HERPES SIMPLEX VIRUS(HSV) DNA BY PCR
HSV 1 DNA: NEGATIVE
HSV 2 DNA: POSITIVE — AB

## 2018-03-07 LAB — CSF CULTURE W GRAM STAIN: Culture: NO GROWTH

## 2018-03-07 LAB — CSF CULTURE

## 2018-03-07 MED ORDER — BUTALBITAL-APAP-CAFFEINE 50-325-40 MG PO TABS: 1.0000 | ORAL_TABLET | Freq: Four times a day (QID) | ORAL | 0 refills | Status: AC | PRN

## 2018-03-07 MED ORDER — AMLODIPINE BESYLATE 10 MG PO TABS: 10.0000 mg | ORAL_TABLET | Freq: Every day | ORAL | 0 refills | Status: DC

## 2018-03-07 MED ORDER — VALACYCLOVIR HCL 1 G PO TABS: 1000.0000 mg | ORAL_TABLET | Freq: Three times a day (TID) | ORAL | 0 refills | Status: AC

## 2018-03-07 NOTE — Discharge Instructions (Signed)
Follow with Primary MD Gaynelle Arabian, MD in 7 days   Get CBC, CMP,  checked  by Primary MD next visit.    Activity: As tolerated with Full fall precautions use walker/cane & assistance as needed   Disposition Home    Diet: Heart Healthy  , with feeding assistance and aspiration precautions.  Increase your fluid intake    On your next visit with your primary care physician please Get Medicines reviewed and adjusted.   Please request your Prim.MD to go over all Hospital Tests and Procedure/Radiological results at the follow up, please get all Hospital records sent to your Prim MD by signing hospital release before you go home.   If you experience worsening of your admission symptoms, develop shortness of breath, life threatening emergency, suicidal or homicidal thoughts you must seek medical attention immediately by calling 911 or calling your MD immediately  if symptoms less severe.  You Must read complete instructions/literature along with all the possible adverse reactions/side effects for all the Medicines you take and that have been prescribed to you. Take any new Medicines after you have completely understood and accpet all the possible adverse reactions/side effects.   Do not drive, operating heavy machinery, perform activities at heights, swimming or participation in water activities or provide baby sitting services if your were admitted for syncope or siezures until you have seen by Primary MD or a Neurologist and advised to do so again.  Do not drive when taking Pain medications.    Do not take more than prescribed Pain, Sleep and Anxiety Medications  Special Instructions: If you have smoked or chewed Tobacco  in the last 2 yrs please stop smoking, stop any regular Alcohol  and or any Recreational drug use.  Wear Seat belts while driving.   Please note  You were cared for by a hospitalist during your hospital stay. If you have any questions about your discharge  medications or the care you received while you were in the hospital after you are discharged, you can call the unit and asked to speak with the hospitalist on call if the hospitalist that took care of you is not available. Once you are discharged, your primary care physician will handle any further medical issues. Please note that NO REFILLS for any discharge medications will be authorized once you are discharged, as it is imperative that you return to your primary care physician (or establish a relationship with a primary care physician if you do not have one) for your aftercare needs so that they can reassess your need for medications and monitor your lab values.

## 2018-03-07 NOTE — Discharge Summary (Signed)
Casey Payne, is a 72 y.o. female  DOB [birth year]  MRN [id number].  Admission date:  03/04/2018  Admitting Physician  Karmen Bongo, MD  Discharge Date:  03/07/2018   Primary MD  Gaynelle Arabian, MD  Recommendations for primary care physician for things to follow:  -plase check CBC, BMP during next visit   Admission Diagnosis  Meningitis [G03.9]   Discharge Diagnosis  Meningitis [G03.9]    Principal Problem:   Meningitis Active Problems:   Essential hypertension      Past Medical History:  Diagnosis Date  . Arthritis    "hands, right shoulder" (03/04/2018)  . Breast cancer, right breast ([place]) 1993  . Hypertension   . Meningitis 1986  . PONV (postoperative nausea and vomiting)   . Viral meningitis 03/04/2018   spinal tap confirms /notes 03/04/2018    Past Surgical History:  Procedure Laterality Date  . APPENDECTOMY    . BREAST LUMPECTOMY Right 1993  . CATARACT EXTRACTION W/ INTRAOCULAR LENS  IMPLANT, BILATERAL Bilateral   . TONSILLECTOMY    . VAGINAL HYSTERECTOMY         History of present illness and  Hospital Course:     Kindly see H&P for history of present illness and admission details, please review complete Labs, Consult reports and Test reports for all details in brief  HPI  from the history and physical done on the day of admission 03/04/2018  HPI: Casey Payne is a 72 y.o. female with medical history significant of breast CA; HTN; and remote meningitis presenting with headache, N/V.  3rd day of severe headache.  Per family, she came to the ER yesterday, had a test for tickborne disease and they gave her antibiotics and discharged her to home.  She returned this AM with severe headache, spinal tap confirms viral meningitis.  +nausea, vomiting.  +photosensitiivity.  No sick contacts.  Review of the ER note from yesterday shows that the patient declined hte  recommended LP several times.  Lyme and RMSF titers pending.  Treated with doxy for tickborne illness and given Tylenol and Tramadol for pain; of note, she did not fill either prescription.   ED Course:  Likely viral meningitis.  Covering for bacterial meningitis but suspected viral.  Has h/o meningitis 33 years ago - ?viral.  On droplet precautions.    Hospital Course  72 y.o.femalewith medical history significant ofbreast CA; HTN; and remote meningitis presenting with headache, N/V.3rd day of severe headache.Per family, she came to the ER yesterday, had a test for tickborne disease andtheygave her antibioticsand discharged her to home. She returned this AM with severe headache, spinal tap confirms viral meningitis. +nausea, vomiting. +photosensitiivity. No sick contacts.  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  Headache  -Is most likely in the setting of her meningitis, and evidently improved today  Hypertension - blood pressure remains significantly uncontrolled despite being on home dose beta-blockers, most likely due to discomfort and IV fluids, was started on amlodipine during hospital stay, I will discharge her with prescription for amlodipine, I have discussed with her, if  systolic blood pressure at home is acceptable in the 120s range, then no need to take it   6/19 UCx - recollect 6/19 BCx - ngtd 6/19 CSF - ngtd 6/19 HSV2 CSF - positive 6/19 RMSF IgG / IgM - negative 6/19 Lyme - negative   Discharge Condition:  Stable   Follow UP  Follow-up Information    Gaynelle Arabian, MD Follow up in 1 week(s).   Specialty:  Family Medicine Contact information: [address], [place] [address] [phone]             Discharge Instructions  and  Discharge Medications     Discharge Instructions    Discharge instructions   Complete by:  As directed    Follow with Primary MD Gaynelle Arabian, MD in 7 days   Get CBC, CMP,  checked  by Primary MD next visit.    Activity: As tolerated with Full fall precautions use walker/cane & assistance as needed   Disposition Home    Diet: Heart Healthy  , with feeding assistance and aspiration precautions.  Increase your fluid intake    On your next visit with your primary care physician please Get Medicines reviewed and adjusted.   Please request your Prim.MD to go over all Hospital Tests and Procedure/Radiological results at the follow up, please get all Hospital records sent to your Prim MD by signing hospital release before you go home.   If you experience worsening of your admission symptoms, develop shortness of breath, life threatening emergency, suicidal or homicidal thoughts you must seek medical attention immediately by calling 911 or calling your MD immediately  if symptoms less severe.  You Must read complete instructions/literature along with all the possible adverse reactions/side effects for all the Medicines you take and that have been prescribed to you. Take any new Medicines after you have completely understood and accpet all the possible adverse reactions/side effects.   Do not drive, operating heavy machinery, perform activities at heights, swimming or  participation in water activities or provide baby sitting services if your were admitted for syncope or siezures until you have seen by Primary MD or a Neurologist and advised to do so again.  Do not drive when taking Pain medications.    Do not take more than prescribed Pain, Sleep and Anxiety Medications  Special Instructions: If you have smoked or chewed Tobacco  in the last 2 yrs please stop smoking, stop any regular Alcohol  and or any Recreational drug use.  Wear Seat belts while driving.   Please note  You were cared for by a hospitalist during your hospital stay. If you have any questions about your discharge medications or the care you received while you were in the hospital after you are discharged,  you can call the unit and asked to speak with the hospitalist on call if the hospitalist that took care of you is not available. Once you are discharged, your primary care physician will handle any further medical issues. Please note that NO REFILLS for any discharge medications will be authorized once you are discharged, as it is imperative that you return to your primary care physician (or establish a relationship with a primary care physician if you do not have one) for your aftercare needs so that they can reassess your need for medications and monitor your lab values.   Increase activity slowly   Complete by:  As directed      Allergies as of 03/07/2018      Reactions   Vicodin [hydrocodone-acetaminophen] Nausea Only      Medication List    TAKE these medications   acetaminophen 500 MG tablet Commonly known as:  TYLENOL Take 2 tablets (1,000 mg total) by mouth every 8 (eight) hours as needed for headache.   amLODipine 10 MG tablet Commonly known as:  NORVASC Take 1 tablet (10 mg total) by mouth daily. Start taking on:  03/08/2018   butalbital-acetaminophen-caffeine 50-325-40 MG tablet Commonly known as:  FIORICET, ESGIC Take 1 tablet by mouth every 6 (six) hours as  needed for headache.   metoprolol succinate 25 MG 24 hr tablet Commonly known as:  TOPROL-XL Take 25 mg by mouth every evening.   valACYclovir 1000 MG tablet Commonly known as:  VALTREX Take 1 tablet (1,000 mg total) by mouth 3 (three) times daily for 6 days.         Diet and Activity recommendation: See Discharge Instructions above   Consults obtained -  None   Major procedures and Radiology Reports - PLEASE review detailed and final reports for all details, in brief -  Lumbar puncture in ED   Dg Chest 2 View  Result Date: 03/04/2018 CLINICAL DATA:  72 y/o  F; fever tonight. EXAM: CHEST - 2 VIEW COMPARISON:  07/21/2007 chest radiograph. FINDINGS: Normal cardiac silhouette. Right axillary surgical clips. Mild bronchitic changes in the lung bases. No focal consolidation. No pleural effusion or pneumothorax. No acute osseous abnormality is evident. IMPRESSION: Mild bronchitic changes in the lung bases.  No focal consolidation. Electronically Signed   By: Kristine Garbe M.D.   On: 03/04/2018 07:08   Ct Head Wo Contrast  Result Date: 03/04/2018 CLINICAL DATA:  Altered level of consciousness. Headache. Patient was in ED yesterday for headache and told to return if headache worsens. History of meningitis 33 years ago. Nausea and vomiting. EXAM: CT HEAD WITHOUT CONTRAST TECHNIQUE: Contiguous axial images were obtained from the base of the skull through the vertex without intravenous contrast. COMPARISON:  07/13/2008 FINDINGS: Brain: No evidence of acute infarction, hemorrhage, hydrocephalus, extra-axial collection or mass lesion/mass effect. Vascular: No hyperdense vessel or unexpected calcification. Skull: Normal. Negative for fracture or focal lesion. Sinuses/Orbits: No acute finding. Other: None. IMPRESSION: No acute intracranial abnormalities. Electronically Signed   By: Lucienne Capers M.D.   On: 03/04/2018 05:10    Micro Results   Recent Results (from the past 240  hour(s))  Urine culture     Status: Abnormal   Collection Time: 03/03/18 10:40 AM  Result Value Ref Range Status   Specimen Description URINE, CLEAN CATCH  Final   Special Requests   Final    NONE Performed at [facility], [place] [address], [place], [place] [address]    Culture MULTIPLE  SPECIES PRESENT, SUGGEST RECOLLECTION (A)  Final   Report Status 03/04/2018 FINAL  Final  Blood culture (routine x 2)     Status: None (Preliminary result)   Collection Time: 03/04/18  3:39 AM  Result Value Ref Range Status   Specimen Description BLOOD LEFT WRIST  Final   Special Requests   Final    BOTTLES DRAWN AEROBIC AND ANAEROBIC Blood Culture results may not be optimal due to an inadequate volume of blood received in culture bottles   Culture   Final    NO GROWTH 2 DAYS Performed at [facility], [place] [address] [place], [place] [address]    Report Status PENDING  Incomplete  Blood culture (routine x 2)     Status: None (Preliminary result)   Collection Time: 03/04/18  3:54 AM  Result Value Ref Range Status   Specimen Description BLOOD LEFT HAND  Final   Special Requests   Final    BOTTLES DRAWN AEROBIC ONLY Blood Culture adequate volume   Culture   Final    NO GROWTH 2 DAYS Performed at [facility], [place] [address] [place], [place] [address]    Report Status PENDING  Incomplete  CSF culture     Status: None   Collection Time: 03/04/18  6:04 AM  Result Value Ref Range Status   Specimen Description CSF  Final   Special Requests NONE  Final   Gram Stain   Final    FEW WBC PRESENT, PREDOMINANTLY MONONUCLEAR NO ORGANISMS SEEN    Culture   Final    NO GROWTH 3 DAYS Performed at [facility], [address], [place], [place] [address]    Report Status 03/07/2018 FINAL  Final       Today   Subjective:   Shannyn Jankowiak today or she is feeling a lot better today, she had one episode of headache this morning, otherwise no recurrence, reports over all headache  has significantly improved, she is eager to go home today.  Objective:   Blood pressure 133/67, pulse 61, temperature 99.7 F (37.6 C), temperature source Oral, resp. rate 18, height 5\' 1"  (1.549 m), weight 52.2 kg (115 lb), SpO2 95 %.   Intake/Output Summary (Last 24 hours) at 03/07/2018 1308 Last data filed at 03/07/2018 0600 Gross per 24 hour  Intake 4709.25 ml  Output 1800 ml  Net 2909.25 ml    Exam Awake Alert, Oriented x 3, No new F.N deficits, Normal affect, no nuchal rigidity, negative meningeal signs Symmetrical Chest wall movement, Good air movement bilaterally, CTAB RRR,No Gallops,Rubs or new Murmurs, No Parasternal Heave +ve B.Sounds, Abd Soft, Non tender,  No rebound -guarding or rigidity. No Cyanosis, Clubbing or edema, No new Rash or bruise  Data Review   CBC w Diff:  Lab Results  Component Value Date   WBC 9.1 03/05/2018   HGB 12.1 03/05/2018   HCT 36.0 03/05/2018   PLT 219 03/05/2018   LYMPHOPCT 15 03/04/2018   MONOPCT 6 03/04/2018   EOSPCT 0 03/04/2018   BASOPCT 0 03/04/2018    CMP:  Lab Results  Component Value Date   NA 137 03/05/2018   K 3.8 03/05/2018   CL 103 03/05/2018   CO2 29 03/05/2018   BUN 8 03/05/2018   CREATININE 0.89 03/05/2018   PROT 6.4 (L) 03/04/2018   ALBUMIN 3.6 03/04/2018   BILITOT 1.1 03/04/2018   ALKPHOS 58 03/04/2018   AST 23 03/04/2018   ALT 17 03/04/2018  .  Total Time in preparing paper work, data evaluation and todays exam - 31 minutes  Phillips Climes M.D on 03/07/2018 at 1:08 PM  [facility]   Office  [phone]

## 2018-03-07 NOTE — Progress Notes (Signed)
Pharmacy Antibiotic Note  Casey Payne is a 72 y.o. female admitted on 03/04/2018 with HA and fever. Pharmacy has been consulted for acyclovir dosing for viral meningitis. Renal function is stable, CrCl ~44 ml/min.  Plan: - Continue acyclovir 520 mg (10mg /kg) IV q12h - Monitor renal function and clinical progress - F/U HSV PCR results  Height: 5\' 1"  (154.9 cm) Weight: 115 lb (52.2 kg) IBW/kg (Calculated) : 47.8  Temp (24hrs), Avg:98.7 F (37.1 C), Min:97.7 F (36.5 C), Max:99.7 F (37.6 C)  Recent Labs  Lab 03/03/18 0854 03/04/18 0339 03/04/18 0432 03/04/18 0611 03/05/18 1710  WBC 8.4 9.1  --   --  9.1  CREATININE 1.00 1.00  --   --  0.89  LATICACIDVEN  --   --  1.76 1.13  --     Estimated Creatinine Clearance: 44.4 mL/min (by C-G formula based on SCr of 0.89 mg/dL).    Allergies  Allergen Reactions  . Vicodin [Hydrocodone-Acetaminophen] Nausea Only    Antimicrobials this admission: Vancomycin 6/19 >> 6/20 Ceftriaxone 6/19 >> 6/20 Acyclovir 6/19 >> Ampicillin 6/19 >> 6/20  Dose adjustments this admission: none  Microbiology results: 6/19 UCx - recollect 6/19 BCx - ngtd 6/19 CSF - ngtd 6/19 HSV - 6/19 RMSF IgG / IgM - negative 6/19 Lyme - negative  Thank you for allowing pharmacy to be a part of this patient's care.  Renold Genta, PharmD, BCPS Clinical Pharmacist Clinical phone for 03/07/2018 until 3p is [phone] Please check AMION for all [place] numbers 03/07/2018 8:57 AM

## 2018-03-07 NOTE — Progress Notes (Signed)
Discharge instructions reviewed with Pt and her husband.  Pt has her prescriptions and reviewed when to take her medications and how to take them. Pt was taken to front of hospital via w/c by staff. Pt discharged home with husband.

## 2018-03-09 LAB — CULTURE, BLOOD (ROUTINE X 2)
Culture: NO GROWTH
Culture: NO GROWTH
SPECIAL REQUESTS: ADEQUATE

## 2018-03-16 DIAGNOSIS — A879 Viral meningitis, unspecified: Secondary | ICD-10-CM | POA: Diagnosis not present

## 2018-03-16 DIAGNOSIS — I10 Essential (primary) hypertension: Secondary | ICD-10-CM | POA: Diagnosis not present

## 2018-04-30 ENCOUNTER — Other Ambulatory Visit: Payer: Self-pay | Admitting: Physician Assistant

## 2018-04-30 ENCOUNTER — Ambulatory Visit
Admission: RE | Admit: 2018-04-30 | Discharge: 2018-04-30 | Disposition: A | Discharge status: Home / Self Care | Payer: PPO | Source: Ambulatory Visit | Attending: Physician Assistant | Admitting: Physician Assistant

## 2018-04-30 DIAGNOSIS — R52 Pain, unspecified: Secondary | ICD-10-CM

## 2018-04-30 DIAGNOSIS — M25511 Pain in right shoulder: Secondary | ICD-10-CM | POA: Diagnosis not present

## 2018-05-22 DIAGNOSIS — Z0000 Encounter for general adult medical examination without abnormal findings: Secondary | ICD-10-CM | POA: Diagnosis not present

## 2018-05-22 DIAGNOSIS — I10 Essential (primary) hypertension: Secondary | ICD-10-CM | POA: Diagnosis not present

## 2018-05-22 DIAGNOSIS — Z1389 Encounter for screening for other disorder: Secondary | ICD-10-CM | POA: Diagnosis not present

## 2018-05-22 DIAGNOSIS — Z1211 Encounter for screening for malignant neoplasm of colon: Secondary | ICD-10-CM | POA: Diagnosis not present

## 2018-05-22 DIAGNOSIS — D692 Other nonthrombocytopenic purpura: Secondary | ICD-10-CM | POA: Diagnosis not present

## 2018-05-22 DIAGNOSIS — E7800 Pure hypercholesterolemia, unspecified: Secondary | ICD-10-CM | POA: Diagnosis not present

## 2018-06-10 DIAGNOSIS — M25511 Pain in right shoulder: Secondary | ICD-10-CM | POA: Diagnosis not present

## 2018-08-10 DIAGNOSIS — Z1211 Encounter for screening for malignant neoplasm of colon: Secondary | ICD-10-CM | POA: Diagnosis not present

## 2018-08-26 DIAGNOSIS — E7800 Pure hypercholesterolemia, unspecified: Secondary | ICD-10-CM | POA: Diagnosis not present

## 2019-06-01 DIAGNOSIS — M25511 Pain in right shoulder: Secondary | ICD-10-CM | POA: Diagnosis not present

## 2019-09-03 DIAGNOSIS — Z1389 Encounter for screening for other disorder: Secondary | ICD-10-CM | POA: Diagnosis not present

## 2019-09-03 DIAGNOSIS — E7800 Pure hypercholesterolemia, unspecified: Secondary | ICD-10-CM | POA: Diagnosis not present

## 2019-09-03 DIAGNOSIS — Z1239 Encounter for other screening for malignant neoplasm of breast: Secondary | ICD-10-CM | POA: Diagnosis not present

## 2019-09-03 DIAGNOSIS — I10 Essential (primary) hypertension: Secondary | ICD-10-CM | POA: Diagnosis not present

## 2019-09-03 DIAGNOSIS — G479 Sleep disorder, unspecified: Secondary | ICD-10-CM | POA: Diagnosis not present

## 2019-09-03 DIAGNOSIS — Z0000 Encounter for general adult medical examination without abnormal findings: Secondary | ICD-10-CM | POA: Diagnosis not present

## 2019-09-13 DIAGNOSIS — E7800 Pure hypercholesterolemia, unspecified: Secondary | ICD-10-CM | POA: Diagnosis not present

## 2019-09-13 DIAGNOSIS — I10 Essential (primary) hypertension: Secondary | ICD-10-CM | POA: Diagnosis not present

## 2019-10-31 ENCOUNTER — Ambulatory Visit: Payer: PPO | Attending: Internal Medicine

## 2019-10-31 DIAGNOSIS — Z23 Encounter for immunization: Secondary | ICD-10-CM

## 2019-10-31 NOTE — Progress Notes (Signed)
   Covid-19 Vaccination Clinic  Name:  Casey Payne    MRN: [id number] DOB: 01-Aug-1946  10/31/2019  [name] was observed post Covid-19 immunization for 15 minutes without incidence. She was provided with Vaccine Information Sheet and instruction to access the V-Safe system.   [name] was instructed to call 911 with any severe reactions post vaccine: Marland Kitchen Difficulty breathing  . Swelling of your face and throat  . A fast heartbeat  . A bad rash all over your body  . Dizziness and weakness    Immunizations Administered    Name Date Dose VIS Date Route   Pfizer COVID-19 Vaccine 10/31/2019  2:19 PM 0.3 mL 08/27/2019 Intramuscular   Manufacturer: [place]   Lot: [id number]   [place]: [id number]

## 2019-11-15 ENCOUNTER — Ambulatory Visit: Payer: Self-pay

## 2019-11-23 ENCOUNTER — Ambulatory Visit: Payer: PPO | Attending: Internal Medicine

## 2019-11-23 DIAGNOSIS — Z23 Encounter for immunization: Secondary | ICD-10-CM | POA: Insufficient documentation

## 2019-11-23 NOTE — Progress Notes (Signed)
   Covid-19 Vaccination Clinic  Name:  Casey Payne    MRN: [id number] DOB: 09-22-1945  11/23/2019  [name] was observed post Covid-19 immunization for 15 minutes without incident. She was provided with Vaccine Information Sheet and instruction to access the V-Safe system.   [name] was instructed to call 911 with any severe reactions post vaccine: Marland Kitchen Difficulty breathing  . Swelling of face and throat  . A fast heartbeat  . A bad rash all over body  . Dizziness and weakness   Immunizations Administered    Name Date Dose VIS Date Route   Pfizer COVID-19 Vaccine 11/23/2019 11:15 AM 0.3 mL 08/27/2019 Intramuscular   Manufacturer: [employer], [employer]   Lot: [id number]   [place]: [id number]

## 2019-11-24 ENCOUNTER — Ambulatory Visit: Payer: PPO

## 2019-12-08 DIAGNOSIS — M7541 Impingement syndrome of right shoulder: Secondary | ICD-10-CM | POA: Diagnosis not present

## 2019-12-08 DIAGNOSIS — M25511 Pain in right shoulder: Secondary | ICD-10-CM | POA: Diagnosis not present

## 2019-12-13 DIAGNOSIS — E7800 Pure hypercholesterolemia, unspecified: Secondary | ICD-10-CM | POA: Diagnosis not present

## 2020-01-17 ENCOUNTER — Other Ambulatory Visit: Payer: Self-pay | Admitting: Family Medicine

## 2020-01-17 DIAGNOSIS — Z1231 Encounter for screening mammogram for malignant neoplasm of breast: Secondary | ICD-10-CM

## 2020-01-31 ENCOUNTER — Ambulatory Visit
Admission: RE | Admit: 2020-01-31 | Discharge: 2020-01-31 | Disposition: A | Discharge status: Home / Self Care | Payer: PPO | Source: Ambulatory Visit | Attending: Family Medicine | Admitting: Family Medicine

## 2020-01-31 ENCOUNTER — Other Ambulatory Visit: Payer: Self-pay

## 2020-01-31 DIAGNOSIS — Z1231 Encounter for screening mammogram for malignant neoplasm of breast: Secondary | ICD-10-CM

## 2020-03-16 DIAGNOSIS — Z79899 Other long term (current) drug therapy: Secondary | ICD-10-CM | POA: Diagnosis not present

## 2020-03-16 DIAGNOSIS — E7800 Pure hypercholesterolemia, unspecified: Secondary | ICD-10-CM | POA: Diagnosis not present

## 2020-06-21 DIAGNOSIS — H04123 Dry eye syndrome of bilateral lacrimal glands: Secondary | ICD-10-CM | POA: Diagnosis not present

## 2020-06-21 DIAGNOSIS — Z961 Presence of intraocular lens: Secondary | ICD-10-CM | POA: Diagnosis not present

## 2020-06-21 DIAGNOSIS — H26493 Other secondary cataract, bilateral: Secondary | ICD-10-CM | POA: Diagnosis not present

## 2020-06-21 DIAGNOSIS — H52203 Unspecified astigmatism, bilateral: Secondary | ICD-10-CM | POA: Diagnosis not present

## 2020-07-27 DIAGNOSIS — M25511 Pain in right shoulder: Secondary | ICD-10-CM | POA: Diagnosis not present

## 2020-09-06 DIAGNOSIS — Z1389 Encounter for screening for other disorder: Secondary | ICD-10-CM | POA: Diagnosis not present

## 2020-09-06 DIAGNOSIS — D692 Other nonthrombocytopenic purpura: Secondary | ICD-10-CM | POA: Diagnosis not present

## 2020-09-06 DIAGNOSIS — E7800 Pure hypercholesterolemia, unspecified: Secondary | ICD-10-CM | POA: Diagnosis not present

## 2020-09-06 DIAGNOSIS — I10 Essential (primary) hypertension: Secondary | ICD-10-CM | POA: Diagnosis not present

## 2020-09-06 DIAGNOSIS — Z0000 Encounter for general adult medical examination without abnormal findings: Secondary | ICD-10-CM | POA: Diagnosis not present

## 2021-02-19 ENCOUNTER — Other Ambulatory Visit: Payer: Self-pay | Admitting: Family Medicine

## 2021-02-19 ENCOUNTER — Ambulatory Visit
Admission: RE | Admit: 2021-02-19 | Discharge: 2021-02-19 | Disposition: A | Discharge status: Home / Self Care | Payer: PPO | Source: Ambulatory Visit | Attending: Family Medicine | Admitting: Family Medicine

## 2021-02-19 DIAGNOSIS — R0600 Dyspnea, unspecified: Secondary | ICD-10-CM

## 2021-02-19 DIAGNOSIS — I10 Essential (primary) hypertension: Secondary | ICD-10-CM | POA: Diagnosis not present

## 2021-02-19 DIAGNOSIS — R0601 Orthopnea: Secondary | ICD-10-CM | POA: Diagnosis not present

## 2021-02-19 DIAGNOSIS — R609 Edema, unspecified: Secondary | ICD-10-CM | POA: Diagnosis not present

## 2021-02-19 DIAGNOSIS — E7800 Pure hypercholesterolemia, unspecified: Secondary | ICD-10-CM | POA: Diagnosis not present

## 2021-02-19 DIAGNOSIS — J449 Chronic obstructive pulmonary disease, unspecified: Secondary | ICD-10-CM | POA: Diagnosis not present

## 2021-02-26 ENCOUNTER — Other Ambulatory Visit (HOSPITAL_COMMUNITY): Payer: Self-pay | Admitting: Family Medicine

## 2021-02-26 DIAGNOSIS — R609 Edema, unspecified: Secondary | ICD-10-CM

## 2021-03-08 ENCOUNTER — Ambulatory Visit (HOSPITAL_COMMUNITY)
Admission: RE | Admit: 2021-03-08 | Discharge: 2021-03-08 | Disposition: A | Discharge status: Home / Self Care | Payer: PPO | Source: Ambulatory Visit | Attending: Family Medicine | Admitting: Family Medicine

## 2021-03-08 ENCOUNTER — Other Ambulatory Visit: Payer: Self-pay

## 2021-03-08 DIAGNOSIS — R609 Edema, unspecified: Secondary | ICD-10-CM | POA: Insufficient documentation

## 2021-03-08 DIAGNOSIS — I10 Essential (primary) hypertension: Secondary | ICD-10-CM | POA: Diagnosis not present

## 2021-03-08 DIAGNOSIS — I340 Nonrheumatic mitral (valve) insufficiency: Secondary | ICD-10-CM | POA: Insufficient documentation

## 2021-03-08 LAB — ECHOCARDIOGRAM COMPLETE
Area-P 1/2: 3.31 cm2
S' Lateral: 3 cm

## 2021-04-25 DIAGNOSIS — M25511 Pain in right shoulder: Secondary | ICD-10-CM | POA: Diagnosis not present

## 2021-08-07 DIAGNOSIS — Z20822 Contact with and (suspected) exposure to covid-19: Secondary | ICD-10-CM | POA: Diagnosis not present

## 2021-09-18 DIAGNOSIS — Z0000 Encounter for general adult medical examination without abnormal findings: Secondary | ICD-10-CM | POA: Diagnosis not present

## 2021-09-18 DIAGNOSIS — E233 Hypothalamic dysfunction, not elsewhere classified: Secondary | ICD-10-CM | POA: Diagnosis not present

## 2021-09-18 DIAGNOSIS — Z1231 Encounter for screening mammogram for malignant neoplasm of breast: Secondary | ICD-10-CM | POA: Diagnosis not present

## 2021-09-18 DIAGNOSIS — I10 Essential (primary) hypertension: Secondary | ICD-10-CM | POA: Diagnosis not present

## 2021-09-18 DIAGNOSIS — E7800 Pure hypercholesterolemia, unspecified: Secondary | ICD-10-CM | POA: Diagnosis not present

## 2021-09-18 DIAGNOSIS — D692 Other nonthrombocytopenic purpura: Secondary | ICD-10-CM | POA: Diagnosis not present

## 2021-09-18 DIAGNOSIS — Z1389 Encounter for screening for other disorder: Secondary | ICD-10-CM | POA: Diagnosis not present

## 2022-01-27 IMAGING — MG DIGITAL SCREENING BILAT W/ TOMO W/ CAD
6 of 10 series · 6 of 30 positions shown · non-contrast
Comparison: Previous exam(s).

CLINICAL DATA: Screening. History of right lumpectomy in 7220.

EXAM:
DIGITAL SCREENING BILATERAL MAMMOGRAM WITH TOMO AND CAD

[R MLO synth-2D (1 of 2)]
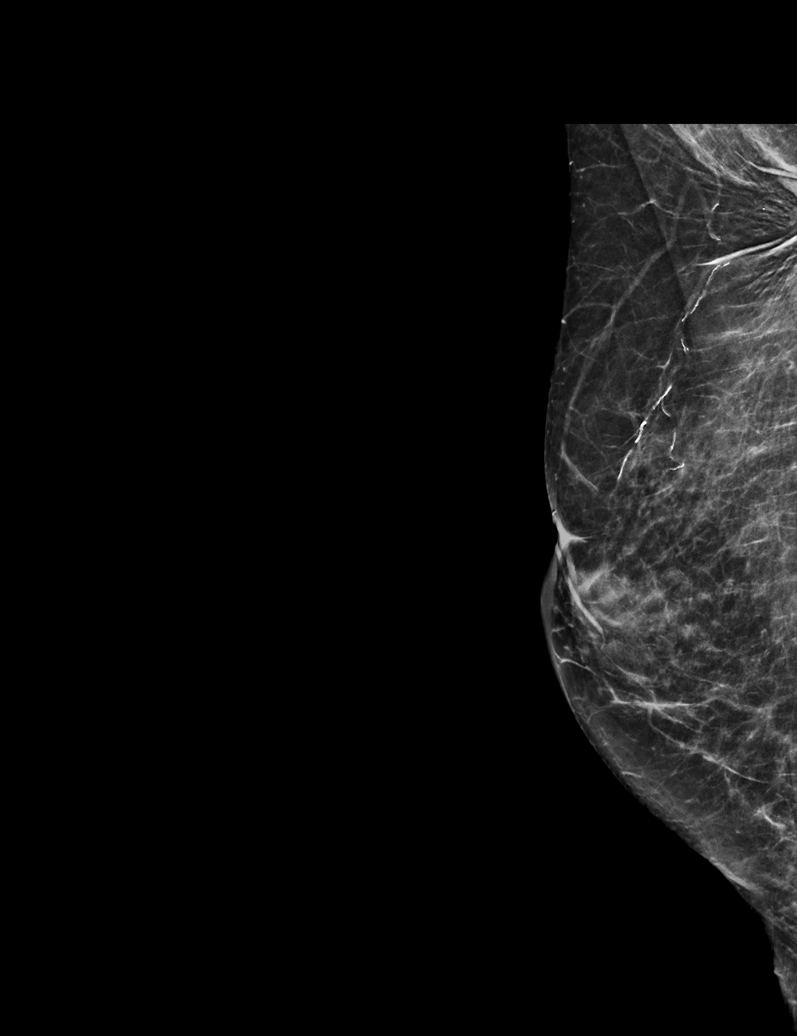

[R CC synth-2D]
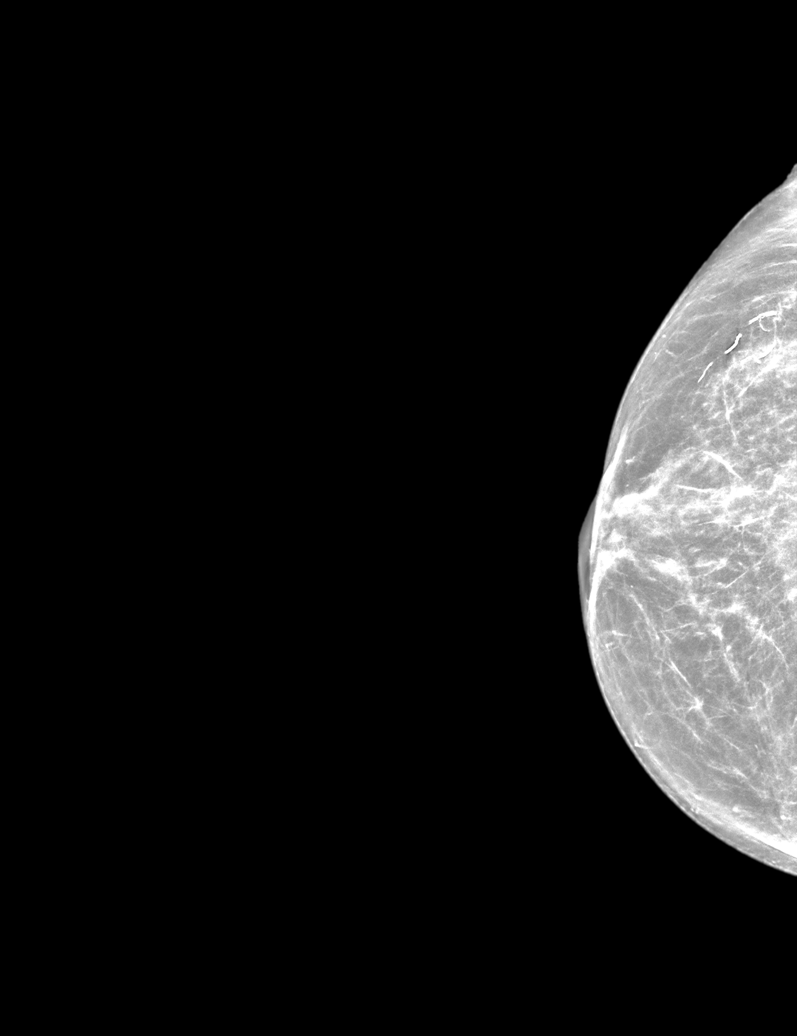

[L MLO synth-2D]
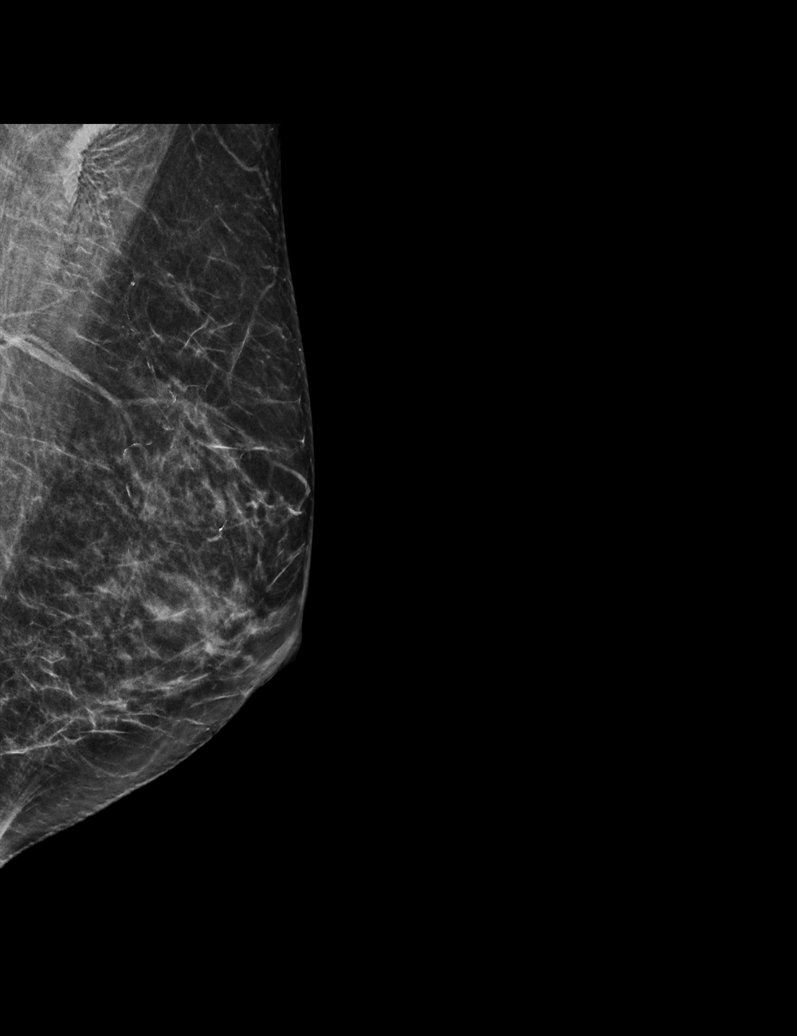

[R MLO synth-2D (2 of 2)]
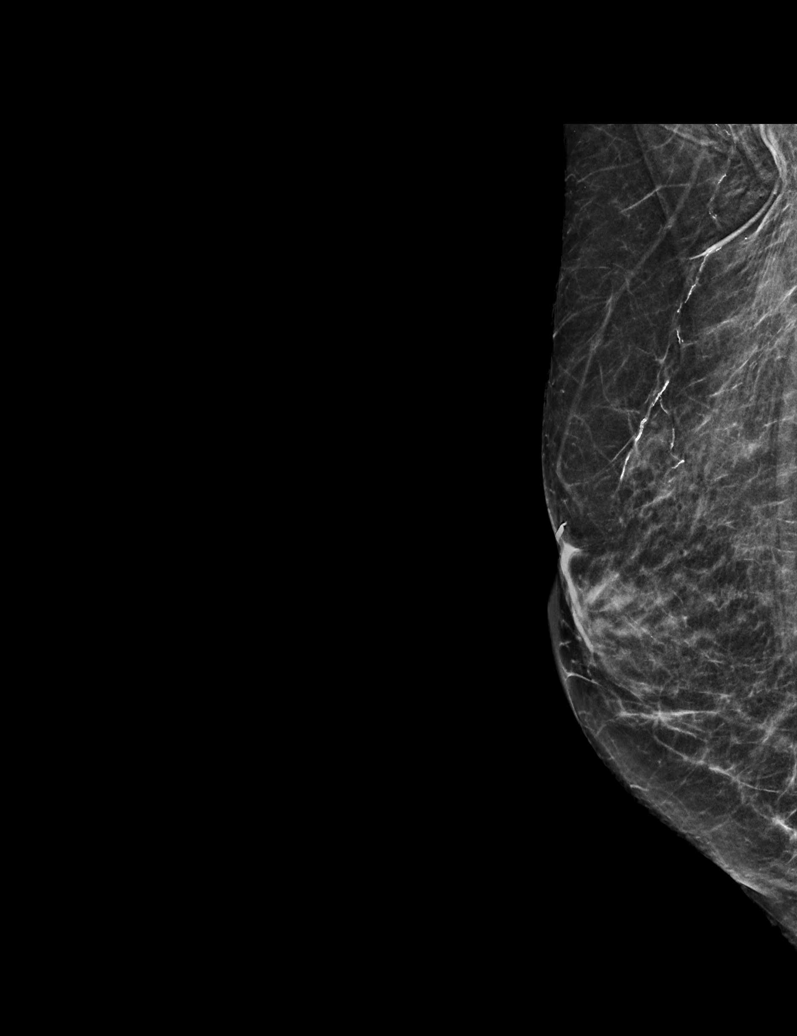

[L CC synth-2D]
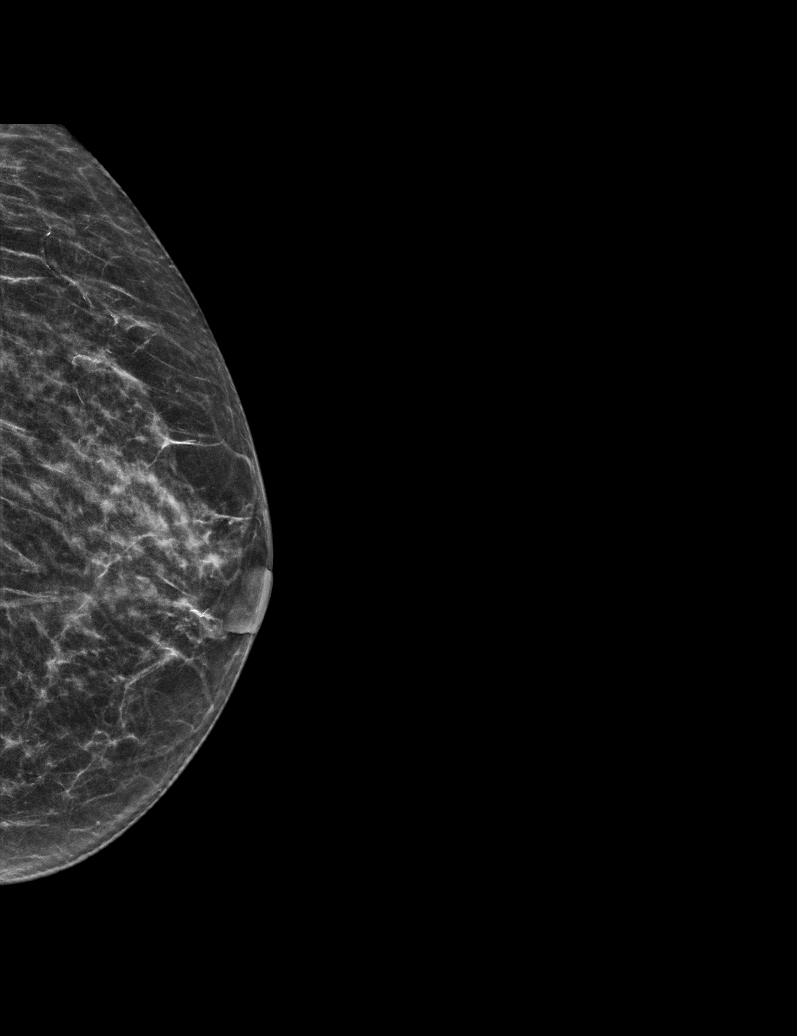

[R MLO tomo · tomo slice 26/51.0]
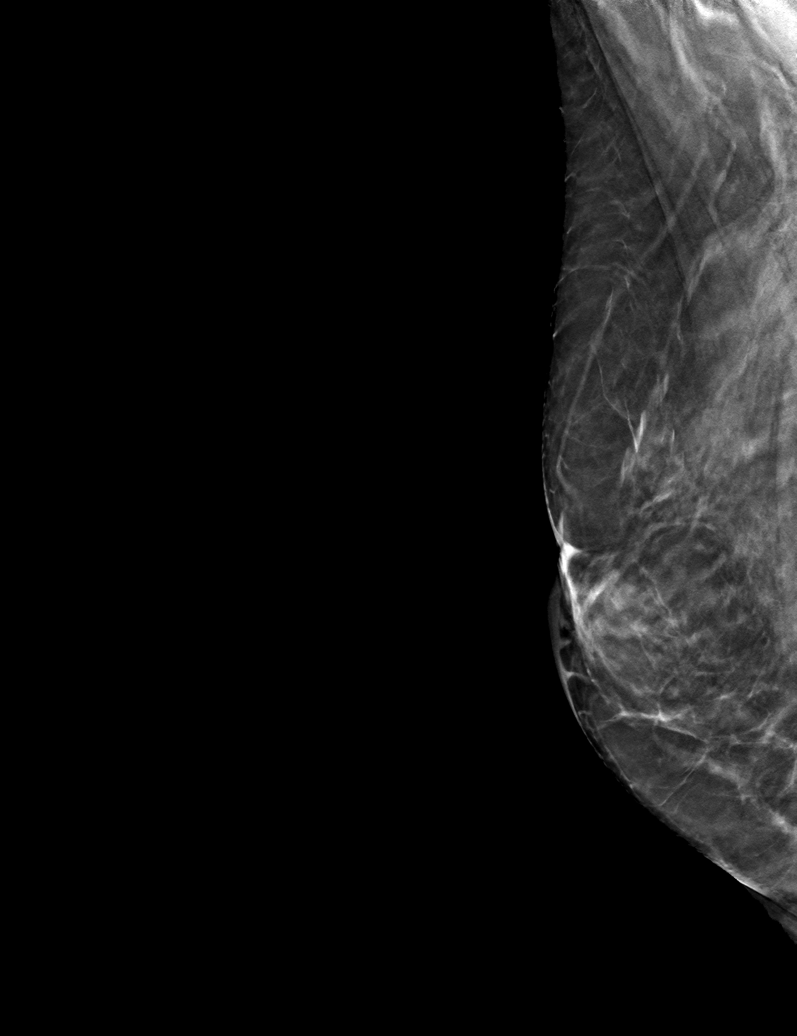

[6 of 30 positions shown; findings below may reference images not displayed]

ACR Breast Density Category b: There are scattered areas of
fibroglandular density.
FINDINGS: There are no findings suspicious for malignancy. Postsurgical
changes are seen in the right breast. Images were processed with
CAD.
IMPRESSION: No mammographic evidence of malignancy. A result letter of this
screening mammogram will be mailed directly to the patient.

RECOMMENDATION:
Screening mammogram in one year. (Code:[id number])

BI-RADS CATEGORY  2: Benign.

## 2022-01-30 DIAGNOSIS — M25511 Pain in right shoulder: Secondary | ICD-10-CM | POA: Diagnosis not present

## 2022-07-08 ENCOUNTER — Other Ambulatory Visit: Payer: Self-pay | Admitting: Family Medicine

## 2022-07-08 DIAGNOSIS — Z1231 Encounter for screening mammogram for malignant neoplasm of breast: Secondary | ICD-10-CM

## 2022-07-09 DIAGNOSIS — R6889 Other general symptoms and signs: Secondary | ICD-10-CM | POA: Diagnosis not present

## 2022-07-09 DIAGNOSIS — R609 Edema, unspecified: Secondary | ICD-10-CM | POA: Diagnosis not present

## 2022-07-09 DIAGNOSIS — K5900 Constipation, unspecified: Secondary | ICD-10-CM | POA: Diagnosis not present

## 2022-07-09 DIAGNOSIS — R42 Dizziness and giddiness: Secondary | ICD-10-CM | POA: Diagnosis not present

## 2022-07-09 DIAGNOSIS — I10 Essential (primary) hypertension: Secondary | ICD-10-CM | POA: Diagnosis not present

## 2022-07-09 DIAGNOSIS — G479 Sleep disorder, unspecified: Secondary | ICD-10-CM | POA: Diagnosis not present

## 2022-07-09 DIAGNOSIS — R61 Generalized hyperhidrosis: Secondary | ICD-10-CM | POA: Diagnosis not present

## 2022-07-09 DIAGNOSIS — M549 Dorsalgia, unspecified: Secondary | ICD-10-CM | POA: Diagnosis not present

## 2022-07-29 ENCOUNTER — Other Ambulatory Visit: Payer: PPO

## 2022-07-29 ENCOUNTER — Ambulatory Visit: Payer: PPO | Admitting: Cardiology

## 2022-07-29 ENCOUNTER — Encounter: Payer: Self-pay | Admitting: Cardiology

## 2022-07-29 VITALS — BP 146/85 | HR 62 | Resp 16 | Ht 61.0 in | Wt 111.0 lb

## 2022-07-29 DIAGNOSIS — R55 Syncope and collapse: Secondary | ICD-10-CM

## 2022-07-29 DIAGNOSIS — I10 Essential (primary) hypertension: Secondary | ICD-10-CM | POA: Diagnosis not present

## 2022-07-29 NOTE — Progress Notes (Addendum)
Patient referred by Casey Arabian, MD for syncope  Subjective:   Gevena Payne, female    DOB: 06-11-1946, 76 y.o.   MRN: [id number]   Chief Complaint  Patient presents with   Coronary Artery Disease   Loss of Consciousness   New Patient (Initial Visit)     HPI  76 y.o. Caucasian female with hypertension, syncope  Patient is retired, stays active.  She has family history of CAD and mitral valve prolapse.  Patient has had 3 episodes of syncope or presyncope since summer 2022.  First episode occurred in summer 2022 when she was sitting, eating ice cream, followed by sudden onset lightheadedness and loss of consciousness.  EMS were called, took her to the hospital, but no severe abnormalities were found.  Subsequently, patient had 2 episodes of presyncope in October and November 2023.  Both episodes occurred while standing, associated with sudden onset of sensation, diaphoresis, lightheadedness, that improved after sitting down and drinking water.  Patient denies any chest pain, shortness of breath symptoms.  She has occasional leg swelling.  She drinks 4-5 drinks in a week, does not smoke, drinks 2 cups of caffeine every day.   Past Medical History:  Diagnosis Date   Arthritis    "hands, right shoulder" (03/04/2018)   Breast cancer ([place])    right   Breast cancer, right breast ([place]) 1993   right   Hypertension    Meningitis 1986   PONV (postoperative nausea and vomiting)    Viral meningitis 03/04/2018   spinal tap confirms /notes 03/04/2018     Past Surgical History:  Procedure Laterality Date   APPENDECTOMY     BREAST LUMPECTOMY Right 1993   CATARACT EXTRACTION W/ INTRAOCULAR LENS  IMPLANT, BILATERAL Bilateral    TONSILLECTOMY     VAGINAL HYSTERECTOMY       Social History   Tobacco Use  Smoking Status Never  Smokeless Tobacco Never    Social History   Substance and Sexual Activity  Alcohol Use Yes   Alcohol/week: 4.0 standard drinks of alcohol    Types: 4 Glasses of wine per week   Comment: occ     Family History  Problem Relation Age of Onset   Heart disease Father    Heart attack Father    Heart disease Sister    Heart disease Sister       Current Outpatient Medications:    amLODipine (NORVASC) 10 MG tablet, Take 1 tablet (10 mg total) by mouth daily., Disp: 30 tablet, Rfl: 0   metoprolol succinate (TOPROL-XL) 25 MG 24 hr tablet, Take 25 mg by mouth every evening., Disp: , Rfl: 3   Omega-3 Fatty Acids (FISH OIL) 1200 MG CAPS, Take 1 capsule by mouth daily at 6 (six) AM., Disp: , Rfl:    rosuvastatin (CRESTOR) 10 MG tablet, Take 10 mg by mouth daily., Disp: , Rfl:    Cardiovascular and other pertinent studies:  Reviewed external labs and tests, independently interpreted  EKG 07/29/2022: Sinus rhythm 52 bpm Low voltage Nonspecific T wave abnormality  Echocardiogram 03/08/2022: 1. Left ventricular ejection fraction, by estimation, is 60 to 65%. The  left ventricle has normal function. The left ventricle has no regional  wall motion abnormalities. Left ventricular diastolic parameters were  normal.   2. Right ventricular systolic function is normal. The right ventricular  size is normal. There is normal pulmonary artery systolic pressure.   3. The mitral valve is abnormal. Mild to moderate mitral valve  regurgitation. No evidence of mitral stenosis.   4. The aortic valve is normal in structure. Aortic valve regurgitation is  not visualized. No aortic stenosis is present.   5. The inferior vena cava is normal in size with greater than 50%  respiratory variability, suggesting right atrial pressure of 3 mmHg.    Recent labs: 2023: Glucose 84, BUN/Cr 12/0.8. EGFR 76. K 4.5 HbA1C NA Chol 174, TG 63, HDL 90, LDL 71 TSH 1.6 normal   Review of Systems  Cardiovascular:  Positive for syncope. Negative for chest pain, dyspnea on exertion, leg swelling and palpitations.  Neurological:  Positive for light-headedness.          Vitals:   07/29/22 0844 07/29/22 0845  BP:    Pulse:    Resp:    SpO2: 99% 99%     Body mass index is 20.97 kg/m. Filed Weights   07/29/22 0836  Weight: 111 lb (50.3 kg)     Objective:   Physical Exam Vitals and nursing note reviewed.  Constitutional:      General: She is not in acute distress. Neck:     Vascular: No JVD.  Cardiovascular:     Rate and Rhythm: Normal rate and regular rhythm.     Heart sounds: Normal heart sounds. No murmur heard. Pulmonary:     Effort: Pulmonary effort is normal.     Breath sounds: Normal breath sounds. No wheezing or rales.  Musculoskeletal:     Right lower leg: No edema.     Left lower leg: No edema.          Visit diagnoses:   ICD-10-CM   1. Essential hypertension  I10 EKG 12-Lead    2. Syncope and collapse  R55 PCV ECHOCARDIOGRAM COMPLETE    PCV CARDIAC STRESS TEST    CT CARDIAC SCORING ([facility] LOCATIONS ONLY)    LONG TERM MONITOR-LIVE TELEMETRY (3-14 DAYS)       Orders Placed This Encounter  Procedures   EKG 12-Lead     Medication changes this visit: Medications Discontinued During This Encounter  Medication Reason   acetaminophen (TYLENOL) 500 MG tablet     No orders of the defined types were placed in this encounter.    Assessment & Recommendations:    76 y.o. Caucasian female with syncope  Syncope: Most likely vasovagal, although concerning given ni prior h/o of th same and first occurrence at age 76. Recommend the following to evaluate for any cardiac etiology. 2-week cardiac telemetry, exercise treadmill stress test, echocardiogram, CT cardiac scoring. I discussed with her regarding counterpressure maneuvers, increase hydration, wearing compression stockings. If above work-up is unyielding, could consider substituting amlodipine and metoprolol with his antihypertensive agents, as well as loop recorder implantation.  Hypertension: Blood pressure elevated today. Given recent history of  syncope and presyncope, not started on any medication today.  We will reassess at subsequent visits.  Further recommendations after above testing.   Thank you for referring the patient to [place]. Please feel free to contact with any questions.   Nigel Mormon, MD Pager: [phone] Office: [phone]

## 2022-07-30 DIAGNOSIS — R55 Syncope and collapse: Secondary | ICD-10-CM | POA: Diagnosis not present

## 2022-08-28 ENCOUNTER — Ambulatory Visit: Payer: PPO

## 2022-08-28 DIAGNOSIS — R55 Syncope and collapse: Secondary | ICD-10-CM | POA: Diagnosis not present

## 2022-08-30 ENCOUNTER — Ambulatory Visit
Admission: RE | Admit: 2022-08-30 | Discharge: 2022-08-30 | Disposition: A | Discharge status: Home / Self Care | Payer: PPO | Source: Ambulatory Visit | Attending: Family Medicine | Admitting: Family Medicine

## 2022-08-30 DIAGNOSIS — Z1231 Encounter for screening mammogram for malignant neoplasm of breast: Secondary | ICD-10-CM | POA: Diagnosis not present

## 2022-09-03 ENCOUNTER — Other Ambulatory Visit: Payer: PPO

## 2022-09-04 ENCOUNTER — Other Ambulatory Visit: Payer: PPO

## 2022-09-06 ENCOUNTER — Ambulatory Visit: Payer: PPO

## 2022-09-06 DIAGNOSIS — R55 Syncope and collapse: Secondary | ICD-10-CM | POA: Diagnosis not present

## 2022-09-09 DIAGNOSIS — R55 Syncope and collapse: Secondary | ICD-10-CM | POA: Diagnosis not present

## 2022-09-13 ENCOUNTER — Ambulatory Visit
Admission: RE | Admit: 2022-09-13 | Discharge: 2022-09-13 | Disposition: A | Discharge status: Home / Self Care | Payer: PPO | Source: Ambulatory Visit | Attending: Cardiology | Admitting: Cardiology

## 2022-09-13 DIAGNOSIS — R55 Syncope and collapse: Secondary | ICD-10-CM

## 2022-09-13 DIAGNOSIS — E7800 Pure hypercholesterolemia, unspecified: Secondary | ICD-10-CM | POA: Diagnosis not present

## 2022-09-19 DIAGNOSIS — I10 Essential (primary) hypertension: Secondary | ICD-10-CM | POA: Diagnosis not present

## 2022-09-19 DIAGNOSIS — Z0000 Encounter for general adult medical examination without abnormal findings: Secondary | ICD-10-CM | POA: Diagnosis not present

## 2022-09-19 DIAGNOSIS — M549 Dorsalgia, unspecified: Secondary | ICD-10-CM | POA: Diagnosis not present

## 2022-09-19 DIAGNOSIS — Z1331 Encounter for screening for depression: Secondary | ICD-10-CM | POA: Diagnosis not present

## 2022-09-19 DIAGNOSIS — E233 Hypothalamic dysfunction, not elsewhere classified: Secondary | ICD-10-CM | POA: Diagnosis not present

## 2022-09-19 DIAGNOSIS — E7800 Pure hypercholesterolemia, unspecified: Secondary | ICD-10-CM | POA: Diagnosis not present

## 2022-09-19 DIAGNOSIS — M545 Low back pain, unspecified: Secondary | ICD-10-CM | POA: Diagnosis not present

## 2022-09-19 DIAGNOSIS — G479 Sleep disorder, unspecified: Secondary | ICD-10-CM | POA: Diagnosis not present

## 2022-10-02 ENCOUNTER — Ambulatory Visit: Payer: PPO | Admitting: Cardiology

## 2022-10-02 ENCOUNTER — Encounter: Payer: Self-pay | Admitting: Cardiology

## 2022-10-02 VITALS — BP 138/77 | HR 61 | Resp 16 | Ht 61.0 in | Wt 113.0 lb

## 2022-10-02 DIAGNOSIS — R55 Syncope and collapse: Secondary | ICD-10-CM

## 2022-10-02 DIAGNOSIS — I10 Essential (primary) hypertension: Secondary | ICD-10-CM | POA: Diagnosis not present

## 2022-10-02 NOTE — Progress Notes (Signed)
Patient referred by Gaynelle Arabian, MD for syncope  Subjective:   Casey Payne, female    DOB: May 21, 1946, 77 y.o.   MRN: [id number]   Chief Complaint  Patient presents with   Hypertension   Loss of Consciousness   Follow-up    F/u   Results     HPI  77 y.o. Caucasian female with hypertension, syncope  Reviewed recent test results with the patient, details below. She has had no recurrent syncope episodes.   Initial consultation visit 08/2022:  Patient is retired, stays active.  She has family history of CAD and mitral valve prolapse.  Patient has had 3 episodes of syncope or presyncope since summer 2022.  First episode occurred in summer 2022 when she was sitting, eating ice cream, followed by sudden onset lightheadedness and loss of consciousness.  EMS were called, took her to the hospital, but no severe abnormalities were found.  Subsequently, patient had 2 episodes of presyncope in October and November 2023.  Both episodes occurred while standing, associated with sudden onset of sensation, diaphoresis, lightheadedness, that improved after sitting down and drinking water.  Patient denies any chest pain, shortness of breath symptoms.  She has occasional leg swelling.  She drinks 4-5 drinks in a week, does not smoke, drinks 2 cups of caffeine every day.   Current Outpatient Medications:    acetaminophen (TYLENOL) 500 MG tablet, Take 500 mg by mouth every 4 (four) hours as needed., Disp: , Rfl:    amLODipine (NORVASC) 5 MG tablet, Take 5 mg by mouth daily., Disp: , Rfl:    metoprolol succinate (TOPROL-XL) 25 MG 24 hr tablet, Take 25 mg by mouth every evening., Disp: , Rfl: 3   naproxen sodium (ALEVE) 220 MG tablet, Take 220 mg by mouth daily as needed., Disp: , Rfl:    Omega-3 Fatty Acids (FISH OIL) 1200 MG CAPS, Take 1 capsule by mouth daily at 6 (six) AM., Disp: , Rfl:    rosuvastatin (CRESTOR) 10 MG tablet, Take 10 mg by mouth daily., Disp: , Rfl:    tiZANidine  (ZANAFLEX) 4 MG tablet, Take 4 mg by mouth every 6 (six) hours as needed., Disp: , Rfl:    Wheat Dextrin (BENEFIBER) POWD, as directed Orally, Disp: , Rfl:    Cardiovascular and other pertinent studies:  Reviewed external labs and tests, independently interpreted  Exercise treadmill stress test 09/06/2022: Exercise treadmill stress test performed using Bruce protocol.  Patient reached 10.1 METS, and 86% of age predicted maximum heart rate.  Exercise capacity was excellent.  No chest pain reported.  Normal heart rate and hemodynamic response. Stress EKG revealed no ischemic changes. Low risk study.  Echocardiogram 08/28/2022:   Normal LV systolic function with EF 57%. Left ventricle cavity is normal in size. Normal left ventricular wall thickness. Normal global wall motion. Normal diastolic filling pattern. Calculated EF 57%. Structurally normal mitral valve.  Moderate (Grade II) mitral regurgitation. Structurally normal tricuspid valve.  Mild to moderate tricuspid regurgitation. No evidence of pulmonary hypertension. RVSP measures 31 mmHg. IVC is dilated with respiratory variation (CVP 8 mm Hg).  Mobile cardiac telemetry 14 days 07/29/2022 - 08/12/2022: Dominant rhythm: Sinus. HR 40-143 bpm. Avg HR 63 bpm, in sinus rhythm. 47 episodes of atrial tachycardia, fastest at 194 bpm for 6 beats, longest for 16 beats at 119 bpm. <1% isolated SVE, couplet/triplets. 0 episodes of V. <1% isolated VE, couplet/triplets. No atrial fibrillation/atrial flutter/VT/high grade AV block, sinus pause >3sec noted. 0 patient triggered  events.   CT cardiac scoring 09/13/2022: Calcium score 0 Small bilateral lower lobe pulmonary nodules, 5 mm or less. No follow-up needed if patient is low-risk (and has no known or suspected primary neoplasm). Non-contrast chest CT can be considered in 12 months if patient is high-risk.  EKG 07/29/2022: Sinus rhythm 52 bpm Low voltage Nonspecific T wave  abnormality  Echocardiogram 03/08/2022: 1. Left ventricular ejection fraction, by estimation, is 60 to 65%. The  left ventricle has normal function. The left ventricle has no regional  wall motion abnormalities. Left ventricular diastolic parameters were  normal.   2. Right ventricular systolic function is normal. The right ventricular  size is normal. There is normal pulmonary artery systolic pressure.   3. The mitral valve is abnormal. Mild to moderate mitral valve  regurgitation. No evidence of mitral stenosis.   4. The aortic valve is normal in structure. Aortic valve regurgitation is  not visualized. No aortic stenosis is present.   5. The inferior vena cava is normal in size with greater than 50%  respiratory variability, suggesting right atrial pressure of 3 mmHg.    Recent labs: 2023: Glucose 84, BUN/Cr 12/0.8. EGFR 76. K 4.5 HbA1C NA Chol 174, TG 63, HDL 90, LDL 71 TSH 1.6 normal   Review of Systems  Cardiovascular:  Negative for chest pain, dyspnea on exertion, leg swelling, palpitations and syncope.  Neurological:  Positive for light-headedness.         Vitals:   10/02/22 1050  BP: 138/77  Pulse: 61  Resp: 16  SpO2: 98%   Orthostatic VS for the past 72 hrs (Last 3 readings):  Orthostatic BP Patient Position BP Location Cuff Size Orthostatic Pulse  10/02/22 1058 130/76 Standing Left Arm Normal 69  10/02/22 1057 135/76 Sitting Left Arm Normal 60  10/02/22 1056 145/77 Supine Left Arm Normal 60     Body mass index is 21.35 kg/m. Filed Weights   10/02/22 1050  Weight: 113 lb (51.3 kg)     Objective:   Physical Exam Vitals and nursing note reviewed.  Constitutional:      General: She is not in acute distress. Neck:     Vascular: No JVD.  Cardiovascular:     Rate and Rhythm: Normal rate and regular rhythm.     Heart sounds: Normal heart sounds. No murmur heard. Pulmonary:     Effort: Pulmonary effort is normal.     Breath sounds: Normal breath  sounds. No wheezing or rales.  Musculoskeletal:     Right lower leg: No edema.     Left lower leg: No edema.          Visit diagnoses:   ICD-10-CM   1. Syncope and collapse  R55     2. Essential hypertension  I10          Assessment & Recommendations:    77 y.o. Caucasian female with syncope  Syncope: Most likely vasovagal. Normal LVEF, moderate MR, mild to moderate TR.  Brief episodes of atrial tachycardia on cardiac telemetry, no other significant arrhythmia.  Normal exercise treadmill stress test Discussed counter pressure maneuvers, increasing fluid intake, compression stockings. Also gave the option of loop recorder. Patient prefers conservative management.   Hypertension: Fairly well controlled.    Thank you for referring the patient to [place]. Please feel free to contact with any questions.   Nigel Mormon, MD Pager: [phone] Office: [phone]

## 2022-10-05 DIAGNOSIS — M5416 Radiculopathy, lumbar region: Secondary | ICD-10-CM | POA: Diagnosis not present

## 2022-10-13 DIAGNOSIS — M48062 Spinal stenosis, lumbar region with neurogenic claudication: Secondary | ICD-10-CM | POA: Diagnosis not present

## 2023-02-16 IMAGING — DX DG CHEST 2V
2 series · 2 of 2 positions shown · non-contrast
Comparison: 03/04/2018

CLINICAL DATA: Intermittent dyspnea.

EXAM:
CHEST - 2 VIEW

[dg chest 2 view (1 of 2)]
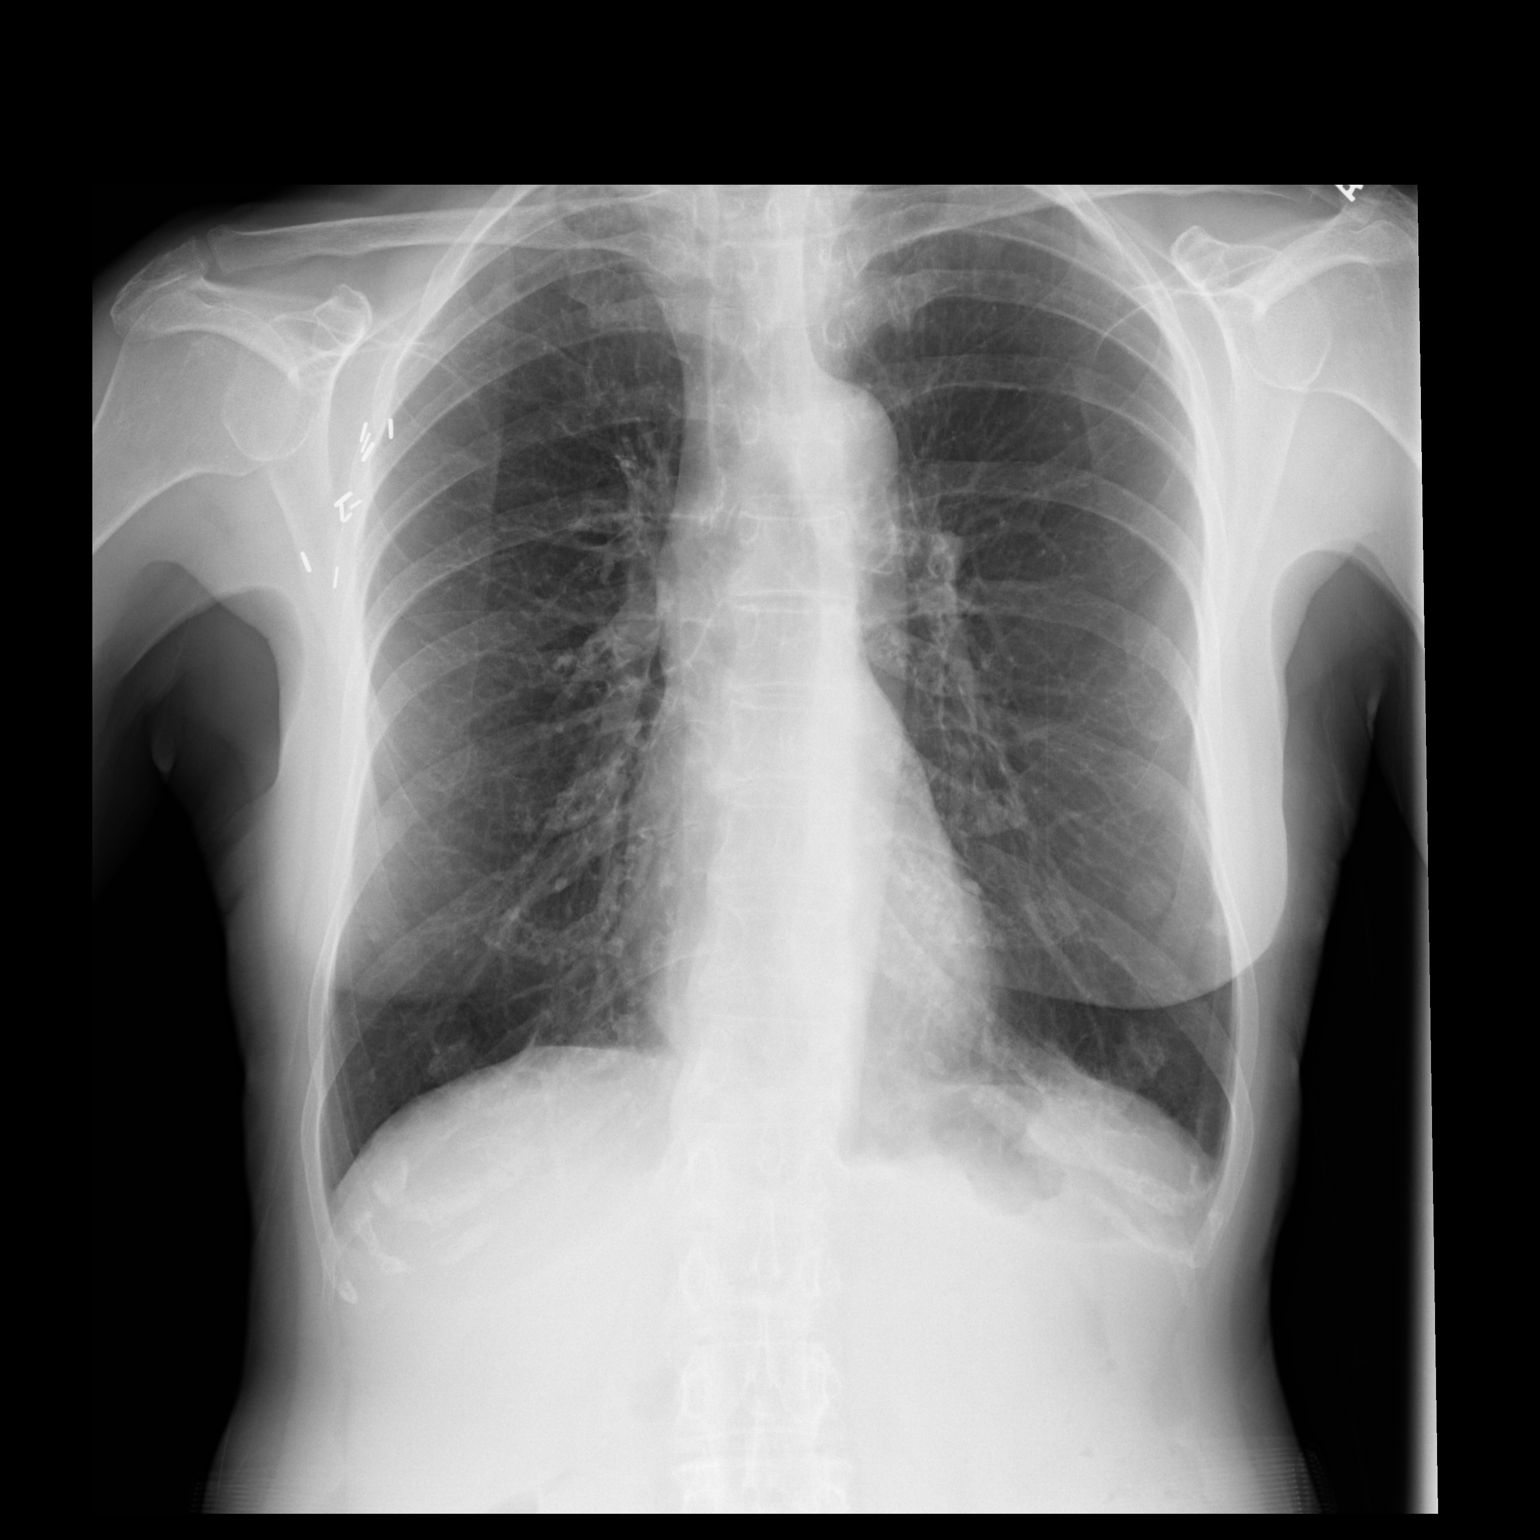

[dg chest 2 view (2 of 2)]
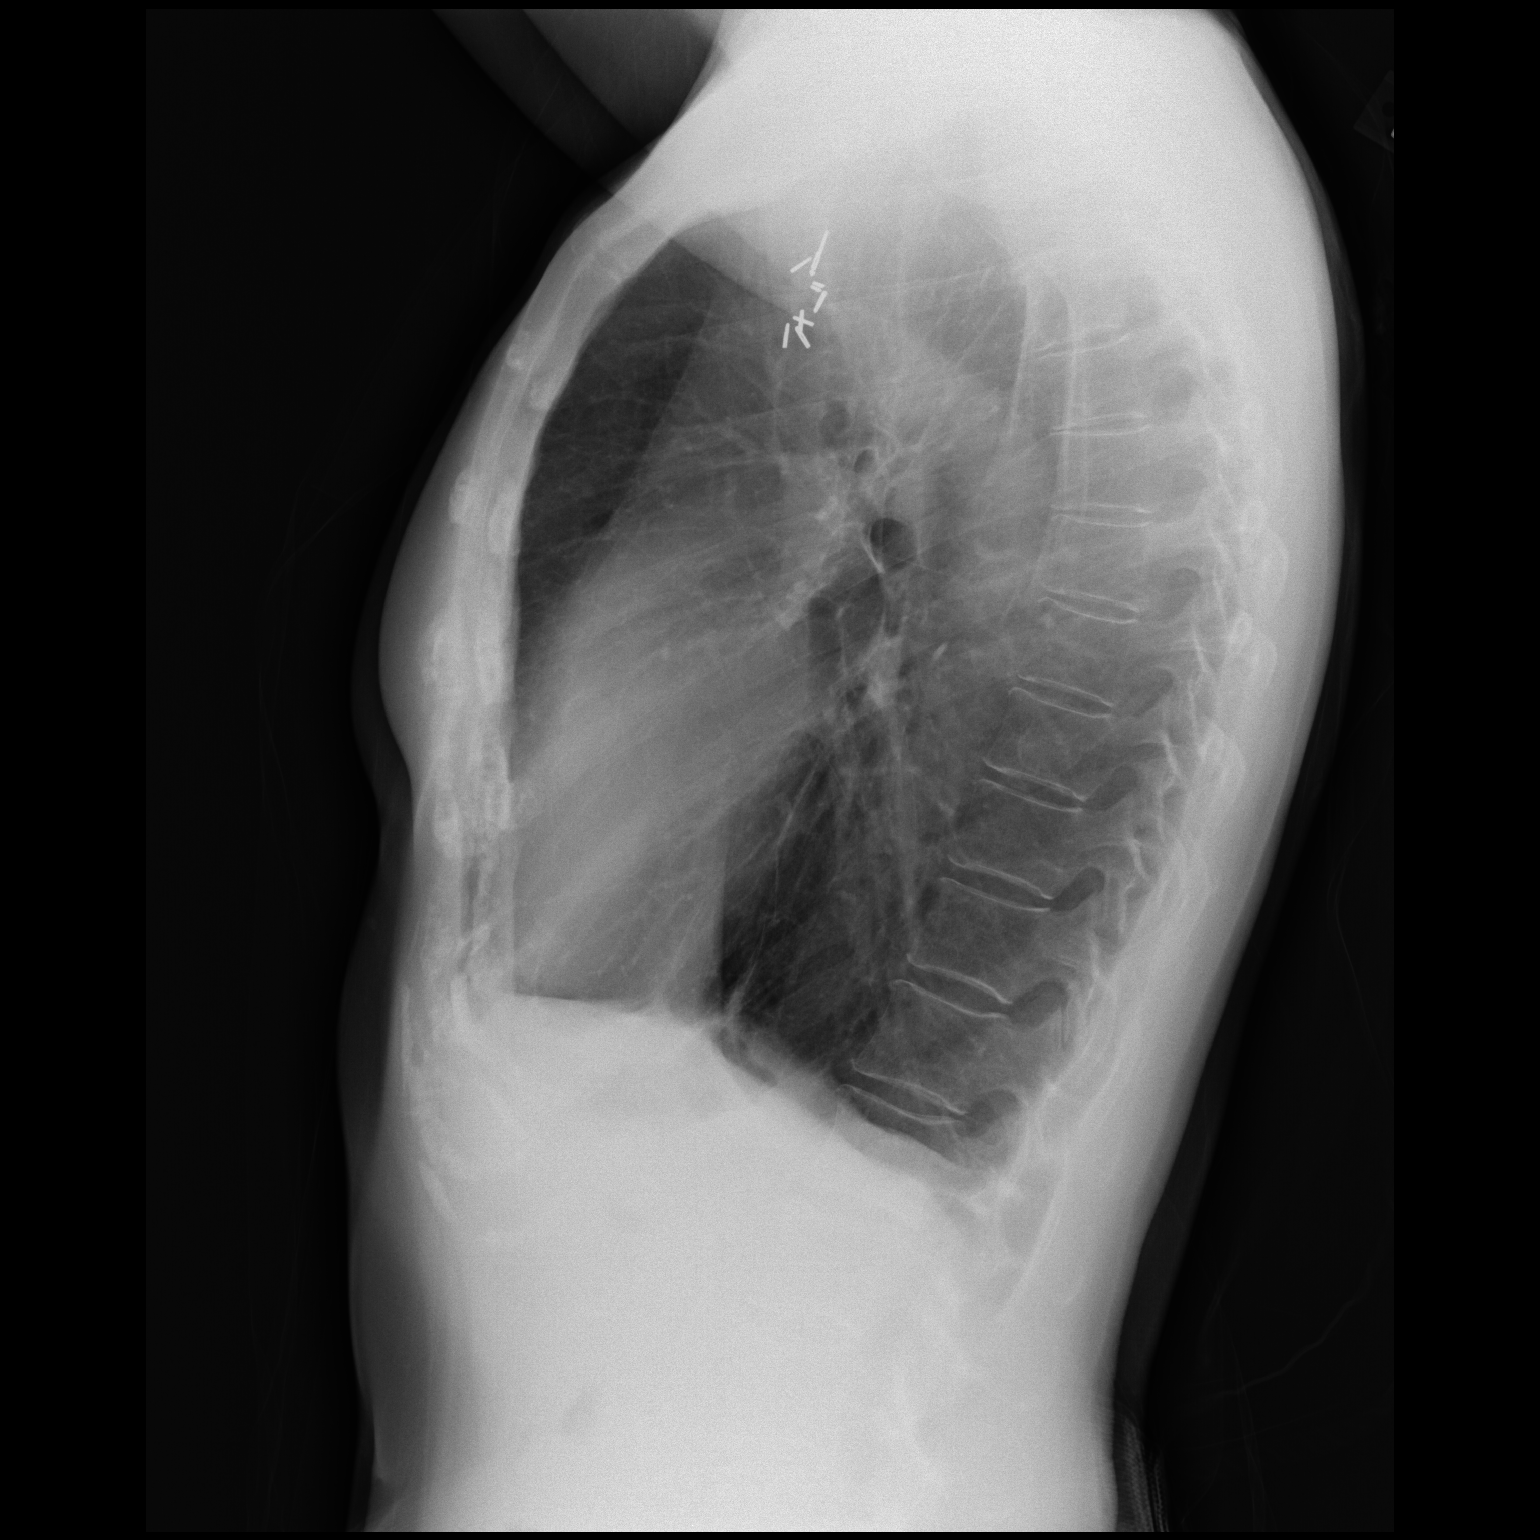

[2 of 2 positions shown; findings below may reference images not displayed]

FINDINGS: Lungs are somewhat hyperexpanded with slight increased AP diameter
of the chest and flattening of the hemidiaphragms suggesting COPD.
No airspace consolidation or effusion. Cardiomediastinal silhouette
and remainder of the exam is unchanged.
IMPRESSION: 1. No acute cardiopulmonary disease.
2. COPD.

## 2023-04-02 ENCOUNTER — Ambulatory Visit: Payer: PPO | Admitting: Cardiology

## 2023-07-15 DIAGNOSIS — H1013 Acute atopic conjunctivitis, bilateral: Secondary | ICD-10-CM | POA: Diagnosis not present

## 2023-07-28 DIAGNOSIS — H1033 Unspecified acute conjunctivitis, bilateral: Secondary | ICD-10-CM | POA: Diagnosis not present
